# Patient Record
Sex: Male | Born: 1970 | Race: Black or African American | Hispanic: No | Marital: Married | Smoking: Never smoker
Health system: Southern US, Community
[De-identification: ages and names within clinical notes are randomized; demographics above are authoritative.]

## PROBLEM LIST (undated history)

## (undated) DIAGNOSIS — E041 Nontoxic single thyroid nodule: Secondary | ICD-10-CM

## (undated) DIAGNOSIS — T7840XA Allergy, unspecified, initial encounter: Secondary | ICD-10-CM

## (undated) DIAGNOSIS — I10 Essential (primary) hypertension: Secondary | ICD-10-CM

## (undated) DIAGNOSIS — E785 Hyperlipidemia, unspecified: Secondary | ICD-10-CM

## (undated) HISTORY — DX: Essential (primary) hypertension: I10

## (undated) HISTORY — DX: Nontoxic single thyroid nodule: E04.1

## (undated) HISTORY — DX: Morbid (severe) obesity due to excess calories: E66.01

## (undated) HISTORY — PX: HERNIA REPAIR: SHX51

## (undated) HISTORY — DX: Allergy, unspecified, initial encounter: T78.40XA

## (undated) HISTORY — DX: Hyperlipidemia, unspecified: E78.5

---

## 2008-02-05 ENCOUNTER — Emergency Department (HOSPITAL_COMMUNITY): Admission: EM | Admit: 2008-02-05 | Discharge: 2008-02-05 | Payer: Self-pay | Admitting: *Deleted

## 2010-05-24 ENCOUNTER — Emergency Department (HOSPITAL_COMMUNITY)
Admission: EM | Admit: 2010-05-24 | Discharge: 2010-05-24 | Disposition: A | Discharge status: Home / Self Care | Payer: No Typology Code available for payment source | Attending: Emergency Medicine | Admitting: Emergency Medicine

## 2010-05-24 ENCOUNTER — Emergency Department (HOSPITAL_COMMUNITY): Payer: No Typology Code available for payment source

## 2010-05-24 DIAGNOSIS — S20219A Contusion of unspecified front wall of thorax, initial encounter: Secondary | ICD-10-CM | POA: Insufficient documentation

## 2010-05-24 DIAGNOSIS — S139XXA Sprain of joints and ligaments of unspecified parts of neck, initial encounter: Secondary | ICD-10-CM | POA: Insufficient documentation

## 2010-05-24 DIAGNOSIS — M25519 Pain in unspecified shoulder: Secondary | ICD-10-CM | POA: Insufficient documentation

## 2010-05-24 DIAGNOSIS — R51 Headache: Secondary | ICD-10-CM | POA: Insufficient documentation

## 2010-05-24 DIAGNOSIS — S40019A Contusion of unspecified shoulder, initial encounter: Secondary | ICD-10-CM | POA: Insufficient documentation

## 2010-05-24 DIAGNOSIS — H1130 Conjunctival hemorrhage, unspecified eye: Secondary | ICD-10-CM | POA: Insufficient documentation

## 2010-05-24 DIAGNOSIS — M542 Cervicalgia: Secondary | ICD-10-CM | POA: Insufficient documentation

## 2012-04-27 ENCOUNTER — Emergency Department (HOSPITAL_BASED_OUTPATIENT_CLINIC_OR_DEPARTMENT_OTHER): Payer: BC Managed Care – PPO

## 2012-04-27 ENCOUNTER — Encounter (HOSPITAL_BASED_OUTPATIENT_CLINIC_OR_DEPARTMENT_OTHER): Payer: Self-pay | Admitting: *Deleted

## 2012-04-27 ENCOUNTER — Emergency Department (HOSPITAL_BASED_OUTPATIENT_CLINIC_OR_DEPARTMENT_OTHER)
Admission: EM | Admit: 2012-04-27 | Discharge: 2012-04-27 | Disposition: A | Discharge status: Home / Self Care | Payer: BC Managed Care – PPO | Attending: Emergency Medicine | Admitting: Emergency Medicine

## 2012-04-27 DIAGNOSIS — R059 Cough, unspecified: Secondary | ICD-10-CM | POA: Insufficient documentation

## 2012-04-27 DIAGNOSIS — IMO0002 Reserved for concepts with insufficient information to code with codable children: Secondary | ICD-10-CM | POA: Insufficient documentation

## 2012-04-27 DIAGNOSIS — S20219A Contusion of unspecified front wall of thorax, initial encounter: Secondary | ICD-10-CM

## 2012-04-27 DIAGNOSIS — Y9389 Activity, other specified: Secondary | ICD-10-CM | POA: Insufficient documentation

## 2012-04-27 DIAGNOSIS — R05 Cough: Secondary | ICD-10-CM | POA: Insufficient documentation

## 2012-04-27 DIAGNOSIS — Y92410 Unspecified street and highway as the place of occurrence of the external cause: Secondary | ICD-10-CM | POA: Insufficient documentation

## 2012-04-27 MED ORDER — HYDROCODONE-ACETAMINOPHEN 5-325 MG PO TABS: 1.0000 | ORAL_TABLET | ORAL | Status: DC | PRN
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
## 2013-04-04 ENCOUNTER — Ambulatory Visit: Payer: BC Managed Care – PPO

## 2013-04-04 ENCOUNTER — Ambulatory Visit (INDEPENDENT_AMBULATORY_CARE_PROVIDER_SITE_OTHER): Payer: BC Managed Care – PPO | Admitting: Family Medicine

## 2013-04-04 VITALS — BP 116/68 | HR 73 | Temp 98.0°F | Resp 16 | Ht 68.0 in | Wt 247.0 lb

## 2013-04-04 DIAGNOSIS — R1031 Right lower quadrant pain: Secondary | ICD-10-CM

## 2013-04-04 DIAGNOSIS — N323 Diverticulum of bladder: Secondary | ICD-10-CM

## 2013-04-04 DIAGNOSIS — E669 Obesity, unspecified: Secondary | ICD-10-CM | POA: Insufficient documentation

## 2013-04-04 DIAGNOSIS — K625 Hemorrhage of anus and rectum: Secondary | ICD-10-CM

## 2013-04-04 LAB — POCT CBC
GRANULOCYTE PERCENT: 41.8 % (ref 37–80)
HCT, POC: 44.1 % (ref 43.5–53.7)
Hemoglobin: 13.9 g/dL — AB (ref 14.1–18.1)
Lymph, poc: 3.4 (ref 0.6–3.4)
MCH, POC: 25.3 pg — AB (ref 27–31.2)
MCHC: 31.5 g/dL — AB (ref 31.8–35.4)
MCV: 80.3 fL (ref 80–97)
MID (CBC): 0.4 (ref 0–0.9)
MPV: 7.9 fL (ref 0–99.8)
PLATELET COUNT, POC: 374 10*3/uL (ref 142–424)
POC Granulocyte: 2.8 (ref 2–6.9)
POC LYMPH PERCENT: 51.7 %L — AB (ref 10–50)
POC MID %: 6.5 % (ref 0–12)
RBC: 5.49 M/uL (ref 4.69–6.13)
RDW, POC: 13.9 %
WBC: 6.6 10*3/uL (ref 4.6–10.2)

## 2013-04-04 LAB — IFOBT (OCCULT BLOOD): IFOBT: NEGATIVE

## 2013-04-04 NOTE — Progress Notes (Signed)
[facility] [address], [place] [id number] [phone]  Date:  04/04/2013   Name:  William RoupMonarch D Stanbery   DOB:  1970/12/10   MRN:  [id number]  PCP:  No PCP Per Patient    Chief Complaint: Rectal Bleeding and Abdominal Pain   History of Present Illness:  William Johnston is a 43 y.o. very pleasant male patient who presents with the following:  He is here today with concern of rectal bleeding.  Just about a week ago he had a BM and noted some blood on the TP and in the bowl.  He has had a couple of other stools with blood since then.  Last night when he was trying to have a BM he had some pain. Yesterday he had more bleeding than the other times.  The blood was bright red.    He did have hernia surgery a few years ago- he has noted pain in her lower abdomen that will come and go over the last week.  This pain seems to be located over his old incisions.  No other abdominal operations.    No nausea or vomiting.  He does not feel that he has been constipated.  However his stools have been hard and he has to strain some.   He is still eating ok.  No fever.  No body aches or chills.   He had bilateral inguinal hernia repair about 5 years ago.   No testicular pain or urinary sx.    He has never had a colonoscopy.   He has kept up with his physicals and all has been ok.   No family history of colon cancer, UC, or Crohnes disease  He is otherwise generally healthy  There are no active problems to display for this patient.   Past Medical History  Diagnosis Date  . Allergy     Past Surgical History  Procedure Laterality Date  . Hernia repair      History  Substance Use Topics  . Smoking status: Never Smoker   . Smokeless tobacco: Not on file  . Alcohol Use: No    History reviewed. No pertinent family history.  No Known Allergies  Medication list has been reviewed and updated.  No current outpatient prescriptions on file prior to visit.   No  current facility-administered medications on file prior to visit.    Review of Systems:  As per HPI- otherwise negative.   Physical Examination: Filed Vitals:   04/04/13 1752  BP: 116/68  Pulse: 73  Temp: 98 F (36.7 C)  Resp: 16   Filed Vitals:   04/04/13 1752  Height: 5\' 8"  (1.727 m)  Weight: 247 lb (112.038 kg)   Body mass index is 37.56 kg/(m^2). Ideal Body Weight: Weight in (lb) to have BMI = 25: 164.1  GEN: WDWN, NAD, Non-toxic, A & O x 3, obese, looks well HEENT: Atraumatic, Normocephalic. Neck supple. No masses, No LAD. Ears and Nose: No external deformity. CV: RRR, No M/G/R. No JVD. No thrill. No extra heart sounds. PULM: CTA B, no wheezes, crackles, rhonchi. No retractions. No resp. distress. No accessory muscle use. ABD: S, ND, +BS. No rebound. No HSM.  He has mild tenderness in the lower abdomen, more in the RLQ EXTR: No c/c/e NEURO Normal gait.  PSYCH: Normally interactive. Conversant. Not depressed or anxious appearing.  Calm demeanor.  Rectal: no gross bleeding. No visible fissure or hemorrhoid.  No pain with rectal exam.  [facility] reading (PRIMARY) by  Dr. Patsy Lager. abd 2 view: loop of bowel with increased gas in pelvis, non- specific. OW negative  ABDOMEN - 2 VIEW  COMPARISON: None.  FINDINGS: Scattered large and small bowel gas is noted. Air is noted within the sigmoid colon without significant dilatation. Postsurgical changes are noted. Bony structures are within normal limits. No free air is seen.  IMPRESSION: Nonspecific abdomen.   Results for orders placed in visit on 04/04/13  POCT CBC      Result Value Ref Range   WBC 6.6  4.6 - 10.2 K/uL   Lymph, poc 3.4  0.6 - 3.4   POC LYMPH PERCENT 51.7 (*) 10 - 50 %L   MID (cbc) 0.4  0 - 0.9   POC MID % 6.5  0 - 12 %M   POC Granulocyte 2.8  2 - 6.9   Granulocyte percent 41.8  37 - 80 %G   RBC 5.49  4.69 - 6.13 M/uL   Hemoglobin 13.9 (*) 14.1 - 18.1 g/dL   HCT, POC 13.2  44.0 - 53.7 %   MCV  80.3  80 - 97 fL   MCH, POC 25.3 (*) 27 - 31.2 pg   MCHC 31.5 (*) 31.8 - 35.4 g/dL   RDW, POC 10.2     Platelet Count, POC 374  142 - 424 K/uL   MPV 7.9  0 - 99.8 fL  IFOBT (OCCULT BLOOD)      Result Value Ref Range   IFOBT Negative     Assessment and Plan: Rectal bleeding - Plan: POCT CBC, IFOBT POC (occult bld, rslt in office), Ambulatory referral to Gastroenterology  RLQ abdominal pain - Plan: DG Abd 2 Views  Rectal bleeding- likely due to a hemorroid or other benign cause but will refer to GI for eval, possible colonoscopy. He agrees with this plan.  If any worsening or gross bleeding in the meantime he will seek care, use colace to soften stool.  Discussed mild RLQ tenderness in detail.  I do not strongly suspect that he could have appendicitis due to normal wbc count, lack or anorexia, vomiting, tachycardia or fever.  However I am certianly glad to order a CT now if he would like. He declines CT but will follow-up right away if pain worsens or persists.    Signed Abbe Amsterdam, MD

## 2013-04-04 NOTE — Patient Instructions (Signed)
Please start using a stool softener such as docusate sodium once or twice a day to keep your stool soft and moving.  Try not to strain to have a BM.  I will refer you to GI- if you do not hear about this in the next couple of weeks please give us a call. If you develop any severe or persistent bleeding, feel faint, or have persistent or severe right lower quadrant or abdominal pain please get help right away

## 2013-04-10 ENCOUNTER — Encounter: Payer: Self-pay | Admitting: Gastroenterology

## 2013-05-21 ENCOUNTER — Ambulatory Visit: Payer: BC Managed Care – PPO | Admitting: Gastroenterology

## 2013-07-08 ENCOUNTER — Ambulatory Visit: Payer: BC Managed Care – PPO | Admitting: Gastroenterology

## 2015-03-05 IMAGING — CR DG ABDOMEN 2V
2 series · 2 of 2 positions shown · non-contrast
Comparison: None.

CLINICAL DATA: Abdominal pain

EXAM:
ABDOMEN - 2 VIEW

[AP (1 of 2)]
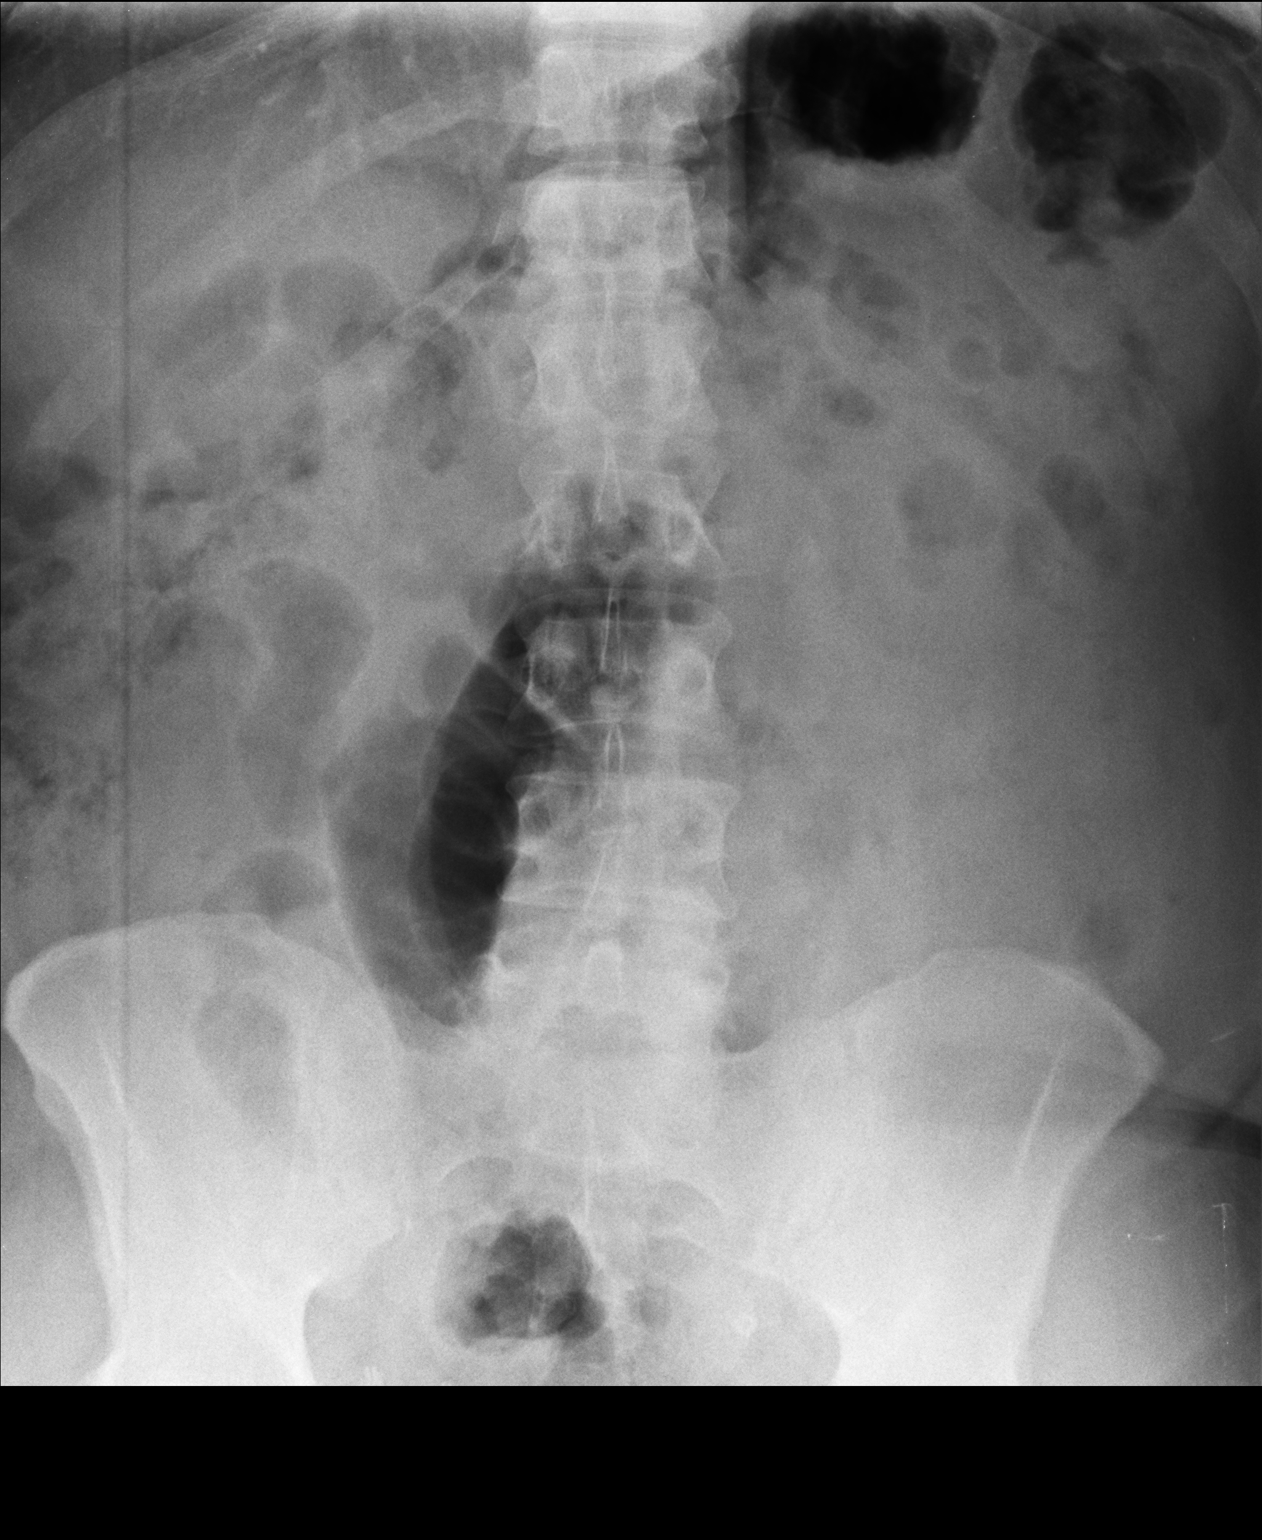

[AP (2 of 2)]
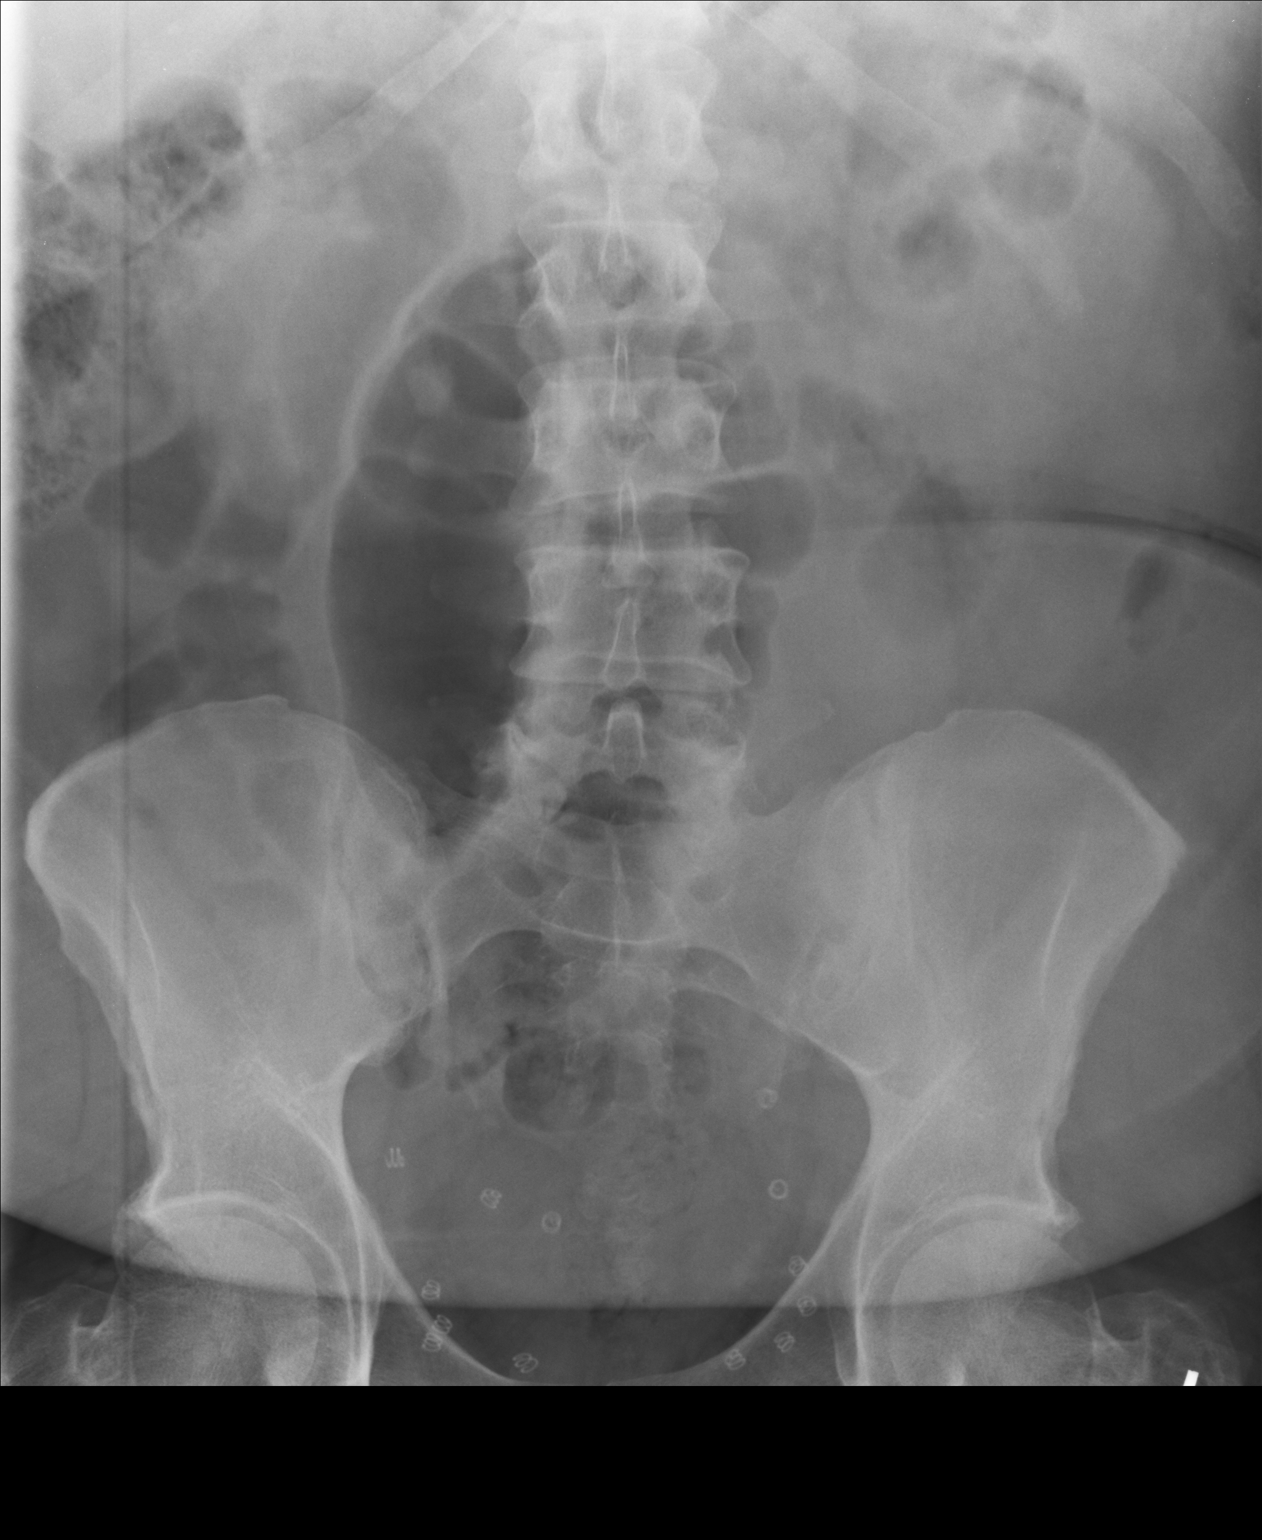

[2 of 2 positions shown; findings below may reference images not displayed]

FINDINGS: Scattered large and small bowel gas is noted. Air is noted within
the sigmoid colon without significant dilatation. Postsurgical
changes are noted. Bony structures are within normal limits. No free
air is seen.
IMPRESSION: Nonspecific abdomen.

## 2015-10-07 ENCOUNTER — Encounter: Payer: Self-pay | Admitting: Podiatry

## 2015-10-07 ENCOUNTER — Ambulatory Visit (INDEPENDENT_AMBULATORY_CARE_PROVIDER_SITE_OTHER): Payer: 59

## 2015-10-07 ENCOUNTER — Ambulatory Visit (INDEPENDENT_AMBULATORY_CARE_PROVIDER_SITE_OTHER): Payer: 59 | Admitting: Podiatry

## 2015-10-07 VITALS — Resp 16 | Ht 69.0 in | Wt 240.0 lb

## 2015-10-07 DIAGNOSIS — M25572 Pain in left ankle and joints of left foot: Secondary | ICD-10-CM

## 2015-10-07 DIAGNOSIS — M779 Enthesopathy, unspecified: Secondary | ICD-10-CM

## 2015-10-07 MED ORDER — DICLOFENAC SODIUM 75 MG PO TBEC: 75.0000 mg | DELAYED_RELEASE_TABLET | Freq: Two times a day (BID) | ORAL | 2 refills | Status: DC

## 2015-10-07 MED ORDER — TRIAMCINOLONE ACETONIDE 10 MG/ML IJ SUSP
10.0000 mg | Freq: Once | INTRAMUSCULAR | Status: AC
  Administered 2015-10-07: 10 mg

## 2015-10-07 NOTE — Progress Notes (Signed)
   Subjective:    Patient ID: William Johnston, male    DOB: [birth year], 45 y.o.   MRN: [id number]  HPI Chief Complaint  Patient presents with  . Ankle Pain    left / with swelling x several wks..pt states "pain is sharp when walking"       Review of Systems  All other systems reviewed and are negative.      Objective:   Physical Exam        Assessment & Plan:

## 2015-10-10 NOTE — Progress Notes (Signed)
Subjective:     Patient ID: William Johnston, male   DOB: [birth year], 45 y.o.   MRN: [id number]  HPI patient presents stating she has flatfeet and also she's been getting discomfort around her medial ankle left with inflammation fluid buildup over the last few months   Review of Systems  All other systems reviewed and are negative.      Objective:   Physical Exam  Constitutional: He is oriented to person, place, and time.  Cardiovascular: Intact distal pulses.   Musculoskeletal: Normal range of motion.  Neurological: He is oriented to person, place, and time.  Skin: Skin is warm.  Nursing note and vitals reviewed.  neurovascular status intact muscle strength is adequate with patient found to have flatfoot deformity and inflammation of the medial ankle left posterior tibial tendon as it comes under the medial malleolus. 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 Discussed long-term orthotics and patient will be seen back to recheck  William Johnston report was negative for signs of fracture and indicated depression of the arch

## 2015-10-22 ENCOUNTER — Ambulatory Visit: Payer: 59 | Admitting: Podiatry

## 2019-07-11 ENCOUNTER — Ambulatory Visit (INDEPENDENT_AMBULATORY_CARE_PROVIDER_SITE_OTHER): Payer: 59

## 2019-07-11 ENCOUNTER — Encounter: Payer: Self-pay | Admitting: Podiatry

## 2019-07-11 ENCOUNTER — Telehealth: Payer: Self-pay | Admitting: Podiatry

## 2019-07-11 ENCOUNTER — Ambulatory Visit (INDEPENDENT_AMBULATORY_CARE_PROVIDER_SITE_OTHER): Payer: 59 | Admitting: Podiatry

## 2019-07-11 ENCOUNTER — Other Ambulatory Visit: Payer: Self-pay

## 2019-07-11 DIAGNOSIS — M7750 Other enthesopathy of unspecified foot: Secondary | ICD-10-CM

## 2019-07-11 DIAGNOSIS — M7752 Other enthesopathy of left foot: Secondary | ICD-10-CM

## 2019-07-11 DIAGNOSIS — L600 Ingrowing nail: Secondary | ICD-10-CM

## 2019-07-11 MED ORDER — NEOMYCIN-POLYMYXIN-HC 3.5-10000-1 OT SOLN: 3.0000 [drp] | Freq: Four times a day (QID) | OTIC | 0 refills | Status: DC

## 2019-07-11 NOTE — Patient Instructions (Signed)
Place 1/4 cup of epsom salts in a quart of warm tap water.  Submerge your foot or feet in the solution and soak for 20 minutes.  This soak should be done twice a day.  Next, remove your foot or feet from solution, blot dry the affected area. Apply ointment and cover if instructed by your doctor.   IF YOUR SKIN BECOMES IRRITATED WHILE USING THESE INSTRUCTIONS, IT IS OKAY TO SWITCH TO  WHITE VINEGAR AND WATER.  As another alternative soak, you may use antibacterial soap and water.  Monitor for any signs/symptoms of infection. Call the office immediately if any occur or go directly to the emergency room. Call with any questions/concerns. Long Term Care Instructions-Post Nail Surgery  You have had your ingrown toenail and root treated with a chemical.  This chemical causes a burn that will drain and ooze like a blister.  This can drain for 6-8 weeks or longer.  It is important to keep this area clean, covered, and follow the soaking instructions dispensed at the time of your surgery.  This area will eventually dry and form a scab.  Once the scab forms you no longer need to soak or apply a dressing.  If at any time you experience an increase in pain, redness, swelling, or drainage, you should contact the office as soon as possible.   Plantar Fasciitis (Heel Spur Syndrome) with Rehab The plantar fascia is a fibrous, ligament-like, soft-tissue structure that spans the bottom of the foot. Plantar fasciitis is a condition that causes pain in the foot due to inflammation of the tissue. SYMPTOMS   Pain and tenderness on the underneath side of the foot.  Pain that worsens with standing or walking. CAUSES  Plantar fasciitis is caused by irritation and injury to the plantar fascia on the underneath side of the foot. Common mechanisms of injury include:  Direct trauma to bottom of the foot.  Damage to a small nerve that runs under the foot where the main fascia attaches to the heel bone.  Stress placed on  the plantar fascia due to bone spurs. RISK INCREASES WITH:   Activities that place stress on the plantar fascia (running, jumping, pivoting, or cutting).  Poor strength and flexibility.  Improperly fitted shoes.  Tight calf muscles.  Flat feet.  Failure to warm-up properly before activity.  Obesity. PREVENTION  Warm up and stretch properly before activity.  Allow for adequate recovery between workouts.  Maintain physical fitness:  Strength, flexibility, and endurance.  Cardiovascular fitness.  Maintain a health body weight.  Avoid stress on the plantar fascia.  Wear properly fitted shoes, including arch supports for individuals who have flat feet.  PROGNOSIS  If treated properly, then the symptoms of plantar fasciitis usually resolve without surgery. However, occasionally surgery is necessary.  RELATED COMPLICATIONS   Recurrent symptoms that may result in a chronic condition.  Problems of the lower back that are caused by compensating for the injury, such as limping.  Pain or weakness of the foot during push-off following surgery.  Chronic inflammation, scarring, and partial or complete fascia tear, occurring more often from repeated injections.  TREATMENT  Treatment initially involves the use of ice and medication to help reduce pain and inflammation. The use of strengthening and stretching exercises may help reduce pain with activity, especially stretches of the Achilles tendon. These exercises may be performed at home or with a therapist. Your caregiver may recommend that you use heel cups of arch supports to help reduce   stress on the plantar fascia. Occasionally, corticosteroid injections are given to reduce inflammation. If symptoms persist for greater than 6 months despite non-surgical (conservative), then surgery may be recommended.   MEDICATION   If pain medication is necessary, then nonsteroidal anti-inflammatory medications, such as aspirin and ibuprofen,  or other minor pain relievers, such as acetaminophen, are often recommended.  Do not take pain medication within 7 days before surgery.  Prescription pain relievers may be given if deemed necessary by your caregiver. Use only as directed and only as much as you need.  Corticosteroid injections may be given by your caregiver. These injections should be reserved for the most serious cases, because they may only be given a certain number of times.  HEAT AND COLD  Cold treatment (icing) relieves pain and reduces inflammation. Cold treatment should be applied for 10 to 15 minutes every 2 to 3 hours for inflammation and pain and immediately after any activity that aggravates your symptoms. Use ice packs or massage the area with a piece of ice (ice massage).  Heat treatment may be used prior to performing the stretching and strengthening activities prescribed by your caregiver, physical therapist, or athletic trainer. Use a heat pack or soak the injury in warm water.  SEEK IMMEDIATE MEDICAL CARE IF:  Treatment seems to offer no benefit, or the condition worsens.  Any medications produce adverse side effects.  EXERCISES- RANGE OF MOTION (ROM) AND STRETCHING EXERCISES - Plantar Fasciitis (Heel Spur Syndrome) These exercises may help you when beginning to rehabilitate your injury. Your symptoms may resolve with or without further involvement from your physician, physical therapist or athletic trainer. While completing these exercises, remember:   Restoring tissue flexibility helps normal motion to return to the joints. This allows healthier, less painful movement and activity.  An effective stretch should be held for at least 30 seconds.  A stretch should never be painful. You should only feel a gentle lengthening or release in the stretched tissue.  RANGE OF MOTION - Toe Extension, Flexion  Sit with your right / left leg crossed over your opposite knee.  Grasp your toes and gently pull them  back toward the top of your foot. You should feel a stretch on the bottom of your toes and/or foot.  Hold this stretch for 10 seconds.  Now, gently pull your toes toward the bottom of your foot. You should feel a stretch on the top of your toes and or foot.  Hold this stretch for 10 seconds. Repeat  times. Complete this stretch 3 times per day.   RANGE OF MOTION - Ankle Dorsiflexion, Active Assisted  Remove shoes and sit on a chair that is preferably not on a carpeted surface.  Place right / left foot under knee. Extend your opposite leg for support.  Keeping your heel down, slide your right / left foot back toward the chair until you feel a stretch at your ankle or calf. If you do not feel a stretch, slide your bottom forward to the edge of the chair, while still keeping your heel down.  Hold this stretch for 10 seconds. Repeat 3 times. Complete this stretch 2 times per day.   STRETCH  Gastroc, Standing  Place hands on wall.  Extend right / left leg, keeping the front knee somewhat bent.  Slightly point your toes inward on your back foot.  Keeping your right / left heel on the floor and your knee straight, shift your weight toward the wall, not allowing your   back to arch.  You should feel a gentle stretch in the right / left calf. Hold this position for 10 seconds. Repeat 3 times. Complete this stretch 2 times per day.  STRETCH  Soleus, Standing  Place hands on wall.  Extend right / left leg, keeping the other knee somewhat bent.  Slightly point your toes inward on your back foot.  Keep your right / left heel on the floor, bend your back knee, and slightly shift your weight over the back leg so that you feel a gentle stretch deep in your back calf.  Hold this position for 10 seconds. Repeat 3 times. Complete this stretch 2 times per day.  STRETCH  Gastrocsoleus, Standing  Note: This exercise can place a lot of stress on your foot and ankle. Please complete this  exercise only if specifically instructed by your caregiver.   Place the ball of your right / left foot on a step, keeping your other foot firmly on the same step.  Hold on to the wall or a rail for balance.  Slowly lift your other foot, allowing your body weight to press your heel down over the edge of the step.  You should feel a stretch in your right / left calf.  Hold this position for 10 seconds.  Repeat this exercise with a slight bend in your right / left knee. Repeat 3 times. Complete this stretch 2 times per day.   STRENGTHENING EXERCISES - Plantar Fasciitis (Heel Spur Syndrome)  These exercises may help you when beginning to rehabilitate your injury. They may resolve your symptoms with or without further involvement from your physician, physical therapist or athletic trainer. While completing these exercises, remember:   Muscles can gain both the endurance and the strength needed for everyday activities through controlled exercises.  Complete these exercises as instructed by your physician, physical therapist or athletic trainer. Progress the resistance and repetitions only as guided.  STRENGTH - Towel Curls  Sit in a chair positioned on a non-carpeted surface.  Place your foot on a towel, keeping your heel on the floor.  Pull the towel toward your heel by only curling your toes. Keep your heel on the floor. Repeat 3 times. Complete this exercise 2 times per day.  STRENGTH - Ankle Inversion  Secure one end of a rubber exercise band/tubing to a fixed object (table, pole). Loop the other end around your foot just before your toes.  Place your fists between your knees. This will focus your strengthening at your ankle.  Slowly, pull your big toe up and in, making sure the band/tubing is positioned to resist the entire motion.  Hold this position for 10 seconds.  Have your muscles resist the band/tubing as it slowly pulls your foot back to the starting position. Repeat 3  times. Complete this exercises 2 times per day.  Document Released: 01/16/2005 Document Revised: 04/10/2011 Document Reviewed: 04/30/2008 [employer] Patient Information 2014 [employer]  

## 2019-07-11 NOTE — Telephone Encounter (Signed)
Pt called to see if an antibiotic was to be called in if not please advise

## 2019-07-13 NOTE — Progress Notes (Signed)
Subjective:   Patient ID: William Johnston, male   DOB: 49 y.o.   MRN: [id number]   HPI Patient presents with 2 different problems with 1 being chronic inflammation swelling of the medial side of the left ankle and admits he probably should have gotten orthotics years ago secondarily he is getting chronic pain in the lateral borders of each hallux and states that they have been very tender and making shoe gear difficult.  Patient states he tries to trim them and soak them himself and he does not smoke likes to be active   Review of Systems  All other systems reviewed and are negative.       Objective:  Physical Exam Vitals and nursing note reviewed.  Constitutional:      Appearance: He is well-developed.  Pulmonary:     Effort: Pulmonary effort is normal.  Musculoskeletal:        General: Normal range of motion.  Skin:    General: Skin is warm.  Neurological:     Mental Status: He is alert.     Neurovascular status found to be intact muscle strength was found to be adequate range of motion within normal limits.  Patient is found to have inflammation of the medial side of the left posterior tib as it comes under the medial malleolus and inserts into the navicular.  Also has incurvated hallux nails of bilateral lateral borders that are painful when pressed and make shoe gear difficult with no active drainage or redness noted     Assessment:  Posterior tibial tendinitis left with ingrown toenail deformity hallux bilateral chronic     Plan:  H&P reviewed conditions recommended treatment for both conditions and patient wants this done.  I explained nail procedure allowed him to read and then signed consent form understanding risk and I infiltrated each hallux 60 mg like Marcaine mixture sterile prep applied and using sterile instrumentation I remove the borders exposed matrix applied phenol 3 applications 30 seconds followed by alcohol lavage sterile dressing.  Gave instructions on  soaks and reappoint and encouraged to leave dressing on 24 hours but take them off earlier if throbbing were to occur.  I then went ahead did sterile prep and injected the posterior tibial tendon near its insertion into the navicular 3 mg dexamethasone Kenalog 5 mg Xylocaine and applied fascial brace to lift up the arch  X-rays indicate moderate depression the arch no indications collapse medial longitudinal arch

## 2019-07-14 ENCOUNTER — Telehealth: Payer: Self-pay | Admitting: *Deleted

## 2019-07-14 NOTE — Telephone Encounter (Signed)
Returned patient's call from 07/11/19. Patient wanted to know if they needed an antibiotic.  I talked to Dr. Charlsie Merles this morning and he said that patient did not need an antibiotic.  I left a voicemail message letting patient know that he did not need the antibiotic.

## 2019-08-06 ENCOUNTER — Ambulatory Visit: Payer: 59 | Admitting: Podiatry

## 2019-08-07 ENCOUNTER — Ambulatory Visit: Payer: 59 | Admitting: Podiatry

## 2019-08-25 ENCOUNTER — Encounter: Payer: Self-pay | Admitting: Podiatry

## 2019-08-25 ENCOUNTER — Ambulatory Visit (INDEPENDENT_AMBULATORY_CARE_PROVIDER_SITE_OTHER): Payer: 59 | Admitting: Podiatry

## 2019-08-25 ENCOUNTER — Other Ambulatory Visit: Payer: Self-pay

## 2019-08-25 VITALS — Temp 98.2°F

## 2019-08-25 DIAGNOSIS — B351 Tinea unguium: Secondary | ICD-10-CM

## 2019-08-25 DIAGNOSIS — M21619 Bunion of unspecified foot: Secondary | ICD-10-CM

## 2019-08-25 LAB — HEPATIC FUNCTION PANEL
AG Ratio: 1.5 (calc) (ref 1.0–2.5)
ALT: 35 U/L (ref 9–46)
AST: 28 U/L (ref 10–40)
Albumin: 4.2 g/dL (ref 3.6–5.1)
Alkaline phosphatase (APISO): 61 U/L (ref 36–130)
Bilirubin, Direct: 0.1 mg/dL (ref 0.0–0.2)
Globulin: 2.8 g/dL (calc) (ref 1.9–3.7)
Indirect Bilirubin: 0.3 mg/dL (calc) (ref 0.2–1.2)
Total Bilirubin: 0.4 mg/dL (ref 0.2–1.2)
Total Protein: 7 g/dL (ref 6.1–8.1)

## 2019-08-25 MED ORDER — TERBINAFINE HCL 250 MG PO TABS: 250.0000 mg | ORAL_TABLET | Freq: Every day | ORAL | 0 refills | Status: DC

## 2019-08-27 NOTE — Progress Notes (Signed)
Subjective:   Patient ID: William Johnston, male   DOB: 49 y.o.   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  He wants to undergo this and is scheduled for laser and antifungal therapy was prescribed today.  I did review with the patient to watch with the oral and if any issues were to occur to let us know when we are doing liver function studies

## 2019-10-10 ENCOUNTER — Other Ambulatory Visit: Payer: Self-pay | Admitting: Internal Medicine

## 2019-10-10 DIAGNOSIS — E041 Nontoxic single thyroid nodule: Secondary | ICD-10-CM

## 2019-10-15 ENCOUNTER — Other Ambulatory Visit: Payer: Self-pay | Admitting: Internal Medicine

## 2019-10-15 DIAGNOSIS — E041 Nontoxic single thyroid nodule: Secondary | ICD-10-CM

## 2019-10-23 ENCOUNTER — Ambulatory Visit
Admission: RE | Admit: 2019-10-23 | Discharge: 2019-10-23 | Disposition: A | Discharge status: Home / Self Care | Payer: 59 | Source: Ambulatory Visit | Attending: Internal Medicine | Admitting: Internal Medicine

## 2019-10-23 ENCOUNTER — Other Ambulatory Visit: Payer: Self-pay

## 2019-10-23 ENCOUNTER — Other Ambulatory Visit (HOSPITAL_COMMUNITY)
Admission: RE | Admit: 2019-10-23 | Discharge: 2019-10-23 | Disposition: A | Discharge status: Home / Self Care | Payer: 59 | Source: Ambulatory Visit | Attending: Student | Admitting: Student

## 2019-10-23 DIAGNOSIS — E041 Nontoxic single thyroid nodule: Secondary | ICD-10-CM | POA: Diagnosis not present

## 2019-10-24 LAB — CYTOLOGY - NON PAP

## 2021-06-10 ENCOUNTER — Emergency Department (HOSPITAL_COMMUNITY)
Admission: EM | Admit: 2021-06-10 | Discharge: 2021-06-11 | Discharge status: Against Medical Advice | Payer: 59 | Source: Home / Self Care

## 2021-06-10 ENCOUNTER — Other Ambulatory Visit: Payer: Self-pay

## 2021-08-27 ENCOUNTER — Emergency Department (HOSPITAL_BASED_OUTPATIENT_CLINIC_OR_DEPARTMENT_OTHER): Payer: 59

## 2021-08-27 ENCOUNTER — Encounter (HOSPITAL_BASED_OUTPATIENT_CLINIC_OR_DEPARTMENT_OTHER): Payer: Self-pay

## 2021-08-27 ENCOUNTER — Other Ambulatory Visit: Payer: Self-pay

## 2021-08-27 ENCOUNTER — Emergency Department (HOSPITAL_BASED_OUTPATIENT_CLINIC_OR_DEPARTMENT_OTHER)
Admission: EM | Admit: 2021-08-27 | Discharge: 2021-08-27 | Disposition: A | Discharge status: Home / Self Care | Payer: 59 | Attending: Emergency Medicine | Admitting: Emergency Medicine

## 2021-08-27 DIAGNOSIS — R079 Chest pain, unspecified: Secondary | ICD-10-CM

## 2021-08-27 DIAGNOSIS — R778 Other specified abnormalities of plasma proteins: Secondary | ICD-10-CM

## 2021-08-27 DIAGNOSIS — R0789 Other chest pain: Secondary | ICD-10-CM | POA: Insufficient documentation

## 2021-08-27 DIAGNOSIS — Z6835 Body mass index (BMI) 35.0-35.9, adult: Secondary | ICD-10-CM | POA: Insufficient documentation

## 2021-08-27 DIAGNOSIS — R7989 Other specified abnormal findings of blood chemistry: Secondary | ICD-10-CM | POA: Diagnosis not present

## 2021-08-27 LAB — CBC
HCT: 45.9 % (ref 39.0–52.0)
Hemoglobin: 14.9 g/dL (ref 13.0–17.0)
MCH: 25.6 pg — ABNORMAL LOW (ref 26.0–34.0)
MCHC: 32.5 g/dL (ref 30.0–36.0)
MCV: 78.7 fL — ABNORMAL LOW (ref 80.0–100.0)
Platelets: 297 10*3/uL (ref 150–400)
RBC: 5.83 MIL/uL — ABNORMAL HIGH (ref 4.22–5.81)
RDW: 13.9 % (ref 11.5–15.5)
WBC: 10.4 10*3/uL (ref 4.0–10.5)
nRBC: 0 % (ref 0.0–0.2)

## 2021-08-27 LAB — BASIC METABOLIC PANEL
Anion gap: 11 (ref 5–15)
BUN: 12 mg/dL (ref 6–20)
CO2: 24 mmol/L (ref 22–32)
Calcium: 9.4 mg/dL (ref 8.9–10.3)
Chloride: 103 mmol/L (ref 98–111)
Creatinine, Ser: 0.86 mg/dL (ref 0.61–1.24)
GFR, Estimated: >60 mL/min (ref >60)
Glucose, Bld: 90 mg/dL (ref 70–99)
Potassium: 3.8 mmol/L (ref 3.5–5.1)
Sodium: 138 mmol/L (ref 135–145)

## 2021-08-27 LAB — TROPONIN I (HIGH SENSITIVITY)
Troponin I (High Sensitivity): 51 ng/L — ABNORMAL HIGH (ref <18)
Troponin I (High Sensitivity): 55 ng/L — ABNORMAL HIGH (ref <18)

## 2021-08-27 MED ORDER — ASPIRIN 81 MG PO CHEW
324.0000 mg | CHEWABLE_TABLET | Freq: Once | ORAL | Status: AC
  Administered 2021-08-27: 324 mg via ORAL
  Filled 2021-08-27: qty 4

## 2021-08-27 NOTE — ED Notes (Signed)
Dc home , dc instructions reviewed. Pt states understanding.

## 2021-08-27 NOTE — ED Triage Notes (Signed)
Patient here POV from Home.  Endorses Left Sided CP that began approximately for approximately 1 Month. Radiates to Left Shoulder and Left Collar Bone. Originally Intermittent and has Continued.   No SOB. No N/V/D. No Cough.   NAD Noted during Triage. A&Ox4. GCS 15. Ambulatory.

## 2021-08-27 NOTE — Discharge Instructions (Addendum)
Take an aspirin daily.  Follow-up with a cardiologist for further evaluation as we discussed.  Return to [facility] emergency room immediately for recurrent chest pain

## 2021-08-27 NOTE — ED Provider Notes (Signed)
[facility] EMERGENCY DEPT Provider Note   CSN: [id number] Arrival date & time: 08/27/21  1339     History  Chief Complaint  Patient presents with   Chest Pain    William Johnston is a 51 y.o. male.   Chest Pain  Pt initially thought he had a muscle injury after having a massage where his left arm was stretched however he did not have any trouble for weeks until this last week.  He gets an aching pain in the left chest.  It goes to the shoulder and arm.  He has taken ibuprofen with some relief.  Episodes last a few minutes.  No increase with activity.  No nausea or shortness of breath. No diaphoresis.  Last episode around 12 or 1pm.  Pt did work out earlier in the day.    Home Medications Prior to Admission medications   Medication Sig Start Date End Date Taking? Authorizing Provider  diclofenac (VOLTAREN) 75 MG EC tablet Take 1 tablet (75 mg total) by mouth 2 (two) times daily. 10/07/15   Wallene Huh, DPM  terbinafine (LAMISIL) 250 MG tablet Take 1 tablet (250 mg total) by mouth daily. 08/25/19   Wallene Huh, DPM      Allergies    Patient has no known allergies.    Review of Systems   Review of Systems  Cardiovascular:  Positive for chest pain.    Physical Exam Updated Vital Signs BP (!) 152/70   Pulse 80   Temp 98.4 F (36.9 C)   Resp 13   Ht 1.753 m (5\' 9" )   Wt 108.9 kg   SpO2 97%   BMI 35.45 kg/m  Physical Exam Vitals and nursing note reviewed.  Constitutional:      General: He is not in acute distress.    Appearance: He is well-developed.     Comments: Increased bmi  HENT:     Head: Normocephalic and atraumatic.     Right Ear: External ear normal.     Left Ear: External ear normal.  Eyes:     General: No scleral icterus.       Right eye: No discharge.        Left eye: No discharge.     Conjunctiva/sclera: Conjunctivae normal.  Neck:     Trachea: No tracheal deviation.  Cardiovascular:     Rate and Rhythm: Normal rate and  regular rhythm.  Pulmonary:     Effort: Pulmonary effort is normal. No respiratory distress.     Breath sounds: Normal breath sounds. No stridor. No wheezing or rales.  Abdominal:     General: Bowel sounds are normal. There is no distension.     Palpations: Abdomen is soft.     Tenderness: There is no abdominal tenderness. There is no guarding or rebound.  Musculoskeletal:        General: No tenderness or deformity.     Cervical back: Neck supple.  Skin:    General: Skin is warm and dry.     Findings: No rash.  Neurological:     General: No focal deficit present.     Mental Status: He is alert.     Cranial Nerves: No cranial nerve deficit (no facial droop, extraocular movements intact, no slurred speech).     Sensory: No sensory deficit.     Motor: No abnormal muscle tone or seizure activity.     Coordination: Coordination normal.  Psychiatric:        Mood  and Affect: Mood normal.     ED Results / Procedures / Treatments   Labs (all labs ordered are listed, but only abnormal results are displayed) Labs Reviewed  CBC - Abnormal; Notable for the following components:      Result Value   RBC 5.83 (*)    MCV 78.7 (*)    MCH 25.6 (*)    All other components within normal limits  TROPONIN I (HIGH SENSITIVITY) - Abnormal; Notable for the following components:   Troponin I (High Sensitivity) 51 (*)    All other components within normal limits  TROPONIN I (HIGH SENSITIVITY) - Abnormal; Notable for the following components:   Troponin I (High Sensitivity) 55 (*)    All other components within normal limits  BASIC METABOLIC PANEL    EKG EKG Interpretation  Date/Time:  Saturday August 27 2021 13:48:12 EDT Ventricular Rate:  85 PR Interval:  168 QRS Duration: 76 QT Interval:  348 QTC Calculation: 414 R Axis:   24 Text Interpretation: Normal sinus rhythm Nonspecific T wave abnormality Abnormal ECG No previous ECGs available Confirmed by Linwood Dibbles [phone]) on 08/27/2021 4:17:53  PM  Radiology DG Chest 2 View  Result Date: 08/27/2021 CLINICAL DATA:  CP EXAM: CHEST - 2 VIEW COMPARISON:  April 27, 2012 FINDINGS: The heart size and mediastinal contours are within normal limits. Both lungs are clear and stable. The visualized skeletal structures are unremarkable. IMPRESSION: No active cardiopulmonary disease. Electronically Signed   By: Marjo Bicker M.D.   On: 08/27/2021 14:41    Procedures Procedures    Medications Ordered in ED Medications  aspirin chewable tablet 324 mg (324 mg Oral Given 08/27/21 1646)    ED Course/ Medical Decision Making/ A&P Clinical Course as of 08/27/21 1736  Sat Aug 27, 2021  1648 Troponin I (High Sensitivity)(!) Elevated, slight increase from last  [JK]  1649 Basic metabolic panel Nl  [JK]  1649 CBC(!) Nl  [JK]  1659 Discussed with Dr Anne Fu.   Would be reasonable for close outpatient eval, coronary CT [JK]    Clinical Course User Index [JK] Linwood Dibbles, MD                           Medical Decision Making Problems Addressed: Chest pain, unspecified type: acute illness or injury that poses a threat to life or bodily functions Elevated troponin: acute illness or injury  Amount and/or Complexity of Data Reviewed Labs: ordered. Decision-making details documented in ED Course. Radiology: ordered and independent interpretation performed. Discussion of management or test interpretation with external provider(s): Case was discussed with Dr. Anne Fu cardiology.  EKG is reassuring.  Troponin is elevated but flat.  Patient is pain-free here.  We will plan discharge and very close outpatient follow-up in the cardiology clinic this week.  Warning signs and precautions discussed with the patient.  He is comfortable with this plan  Risk OTC drugs.   Patient presented with chest pain.  Concerning for the possibility of cardiac etiology.  No evidence of pneumonia.  Symptoms not suggestive of PE.  Patient's troponins were slightly elevated  but unchanged.  He has not actually been having symptoms with exercise.  Discussed with Dr. Anne Fu regarding possible hospitalization and further work-up versus close cardiology follow-up.  Patient is comfortable with following up and as an outpatient.  Plan on cardiology eval this week.  I instructed the patient take an aspirin daily.  He understands to return for recurrent  symptoms.        Final Clinical Impression(s) / ED Diagnoses Final diagnoses:  Chest pain, unspecified type  Elevated troponin    Rx / DC Orders ED Discharge Orders          Ordered    Ambulatory referral to Cardiology       Comments: If you have not heard from the Cardiology office within the next 72 hours please call [phone].   08/27/21 1735              Linwood Dibbles, MD 08/27/21 1737

## 2021-08-29 ENCOUNTER — Ambulatory Visit (INDEPENDENT_AMBULATORY_CARE_PROVIDER_SITE_OTHER): Payer: 59 | Admitting: Cardiology

## 2021-08-29 ENCOUNTER — Encounter: Payer: Self-pay | Admitting: Cardiology

## 2021-08-29 VITALS — BP 140/80 | HR 72 | Ht 69.5 in | Wt 281.0 lb

## 2021-08-29 DIAGNOSIS — R072 Precordial pain: Secondary | ICD-10-CM | POA: Diagnosis not present

## 2021-08-29 DIAGNOSIS — E782 Mixed hyperlipidemia: Secondary | ICD-10-CM

## 2021-08-29 DIAGNOSIS — I10 Essential (primary) hypertension: Secondary | ICD-10-CM

## 2021-08-29 MED ORDER — METOPROLOL TARTRATE 100 MG PO TABS: ORAL_TABLET | ORAL | 0 refills | Status: DC

## 2021-08-29 NOTE — Patient Instructions (Addendum)
Medication Instructions:  Continue same medications   Lab Work: None ordered   Testing/Procedures: Coronary CT   will be scheduled after approved by insurance     Follow instructions below   Follow-Up: At [facility], you and your health needs are our priority.  As part of our continuing mission to provide you with exceptional heart care, we have created designated Provider Care Teams.  These Care Teams include your primary Cardiologist (physician) and Advanced Practice Providers (APPs -  Physician Assistants and Nurse Practitioners) who all work together to provide you with the care you need, when you need it.  We recommend signing up for the patient portal called "MyChart".  Sign up information is provided on this After Visit Summary.  MyChart is used to connect with patients for Virtual Visits (Telemedicine).  Patients are able to view lab/test results, encounter notes, upcoming appointments, etc.  Non-urgent messages can be sent to your provider as well.   To learn more about what you can do with MyChart, go to [email or web address].      Your next appointment:  To Be Determined    The format for your next appointment: Office    Provider:  Dr.Jordan      Your cardiac CT will be scheduled at one of the below locations:   [facility] [address] [place], [place] [address] [phone]  OR  [facility] [address] [place], [place] [address] [phone]  If scheduled at [facility], please arrive at the [facility] Entrance (Entrance C2) of [facility] 30 minutes prior to test start time. You can use the FREE valet parking offered at entrance C (encouraged to control the heart rate for the test)  Proceed to the [facility] Radiology Department (first floor) to check-in and test prep.  All radiology patients and guests should use entrance C2 at [facility],  accessed from [facility], even though the hospital's physical address listed is [address].    If scheduled at [facility], please arrive 15 mins early for check-in and test prep.  Please follow these instructions carefully (unless otherwise directed):  Hold all erectile dysfunction medications at least 3 days (72 hrs) prior to test.  On the Night Before the Test: Be sure to Drink plenty of water. Do not consume any caffeinated/decaffeinated beverages or chocolate 12 hours prior to your test. Do not take any antihistamines 12 hours prior to your test.   On the Day of the Test: Drink plenty of water until 1 hour prior to the test. Do not eat any food 4 hours prior to the test. You may take your regular medications prior to the test.  Take metoprolol 100 mg two hours prior to test.          After the Test: Drink plenty of water. After receiving IV contrast, you may experience a mild flushed feeling. This is normal. On occasion, you may experience a mild rash up to 24 hours after the test. This is not dangerous. If this occurs, you can take Benadryl 25 mg and increase your fluid intake. If you experience trouble breathing, this can be serious. If it is severe call 911 IMMEDIATELY. If it is mild, please call our office.   We will call to schedule your test 2-4 weeks out understanding that some insurance companies will need an authorization prior to the service being performed.   For  non-scheduling related questions, please contact the cardiac imaging nurse navigator should you have any questions/concerns: Rockwell Alexandria, Cardiac Imaging Nurse Navigator Larey Brick, Cardiac Imaging Nurse Navigator [facility] Direct Office Dial: [phone]   For scheduling needs, including cancellations and rescheduling, please call [place], [phone].    Important Information About Sugar

## 2021-08-29 NOTE — Progress Notes (Signed)
Cardiology Office Note   Date:  08/29/2021   ID:  JACOBI NILE, DOB [birth year], MRN [id number]  PCP:  Renford Dills, MD  Cardiologist:   Sibley Rolison [place], MD   No chief complaint on file.     History of Present Illness: William Johnston is a 51 y.o. male who is seen at the request of Dr Lynelle Doctor for evaluation of chest pain. He has a history of morbid obesity, HLD, and elevated BP that has not required medication. He reports intermittent chest pain. Over the weekend he noted pain in his central chest radiating to his left shoulder and some pain in his left clavicle. He went to the ED. Ecg showed no acute change. Troponins were 51 and 55. He is currently pain free. 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     Past Medical History:  Diagnosis Date   Allergy    Hyperlipidemia    Hypertension    Morbid obesity (HCC)    Thyroid nodule     Past Surgical History:  Procedure Laterality Date   HERNIA REPAIR       Current Outpatient Medications  Medication Sig Dispense Refill   Multiple Vitamin (MULTIVITAMIN) tablet Take 1 tablet by mouth daily.     tadalafil (CIALIS) 20 MG tablet Take 20 mg by mouth daily as needed.     No current facility-administered medications for this visit.    Allergies:   Patient has no known allergies.    Social History:  The patient  reports that he has never smoked. He has never used smokeless tobacco. He reports that he does not drink alcohol and does not use drugs.   Family History:  The patient's family history includes Ovarian cancer in his mother.    ROS:  Please see the history of present illness.   Otherwise, review of systems are positive for none.   All other systems are reviewed and negative.    PHYSICAL EXAM: VS:  BP 140/80   Pulse 72   Ht 5' 9.5" (1.765 m)   Wt 281 lb (127.5 kg)   SpO2 97%   BMI 40.90 kg/m  , BMI Body mass index is 40.9 kg/m. GEN: Well nourished, obese, in no acute distress HEENT:  normal Neck: no JVD, carotid bruits, or masses Cardiac: RRR; no murmurs, rubs, or gallops,no edema  Respiratory:  clear to auscultation bilaterally, normal work of breathing GI: soft, nontender, nondistended, + BS MS: no deformity or atrophy Skin: warm and dry, no rash Neuro:  Strength and sensation are intact Psych: euthymic mood, full affect   EKG:  EKG is not ordered today. The ekg ordered 08/27/21 demonstrates NSR with nonspecific T wave flattening inferiorly. I have personally reviewed and interpreted this study.    Recent Labs: 08/27/2021: BUN 12; Creatinine, Ser 0.86; Hemoglobin 14.9; Platelets 297; Potassium 3.8; Sodium 138   Labs dated 05/30/21: cholesterol 193, triglycerides 269, LDL 111, HDL 35. CMET and TSH normal.  Lipid Panel No results found for: "CHOL", "TRIG", "HDL", "CHOLHDL", "VLDL", "LDLCALC", "LDLDIRECT"    Wt Readings from Last 3 Encounters:  08/29/21 281 lb (127.5 kg)  08/27/21 240 lb 1.3 oz (108.9 kg)  10/07/15 240 lb (108.9 kg)      Other studies Reviewed: Additional studies/ records that were reviewed today include: none. Review of the above records demonstrates: N/A   ASSESSMENT AND PLAN:  1.  Precordial chest pain with intermediate risk for CAD. We discussed need for ischemic  evaluation. We discussed options including stress testing, coronary CTA or cardiac cath. After discussion recommend coronary CTA. This will also help inform us about how aggressively we need to treat his lipids 2. HTN managed medically for now. May need to consider antihypertensive therapy 3. HLD mixed. Encourage weight loss and healthy diet. If he has CAD on CT will need statin therapy 4. Morbid obesity.    Current medicines are reviewed at length with the patient today.  The patient does not have concerns regarding medicines.  The following changes have been made:  no change  Labs/ tests ordered today include:   Orders Placed This Encounter  Procedures   CT CORONARY  MORPH W/CTA COR W/SCORE W/CA W/CM &/OR WO/CM         Disposition:   FU with me post CT  Signed, Arushi Partridge [place], MD  08/29/2021 10:55 AM    [facility] [address] [place], [place], [address] Phone [phone], Fax [phone]

## 2021-09-03 ENCOUNTER — Telehealth: Payer: Self-pay | Admitting: Cardiology

## 2021-09-03 NOTE — Telephone Encounter (Signed)
Patient called stating that he has been having chest discomfort that he describes as a discomfort over his sternum that hurts to touch his sternum.  He has some back soreness and is constipated.  It comes and goes throughout the day.  He says that touching his sternum makes the discomfort worse and feels like something is off. Movement makes it worse.  This is not the symptoms that he had when he saw Dr. Sw[place]iland recently.    I think his symptoms sound musculoskeletal but he keeps describing an achiness as well.  I recommended that he go to the ER to be evaluated

## 2021-09-06 ENCOUNTER — Telehealth: Payer: Self-pay | Admitting: Cardiology

## 2021-09-06 NOTE — Telephone Encounter (Signed)
Pt c/o of Chest Pain: STAT if CP now or developed within 24 hours  1. Are you having CP right now?  Not at this time-Discomfort in his chest- hard to describe  2. Are you experiencing any other symptoms (ex. SOB, nausea, vomiting, sweating)? Short of breath a little  3. How long have you been experiencing CP? 2 to 3 days  4. Is your CP continuous or coming and going? Comes and goes  5. Have you taken Nitroglycerin? neverr had any

## 2021-09-06 NOTE — Telephone Encounter (Signed)
I would recommend he take OTC antireflux therapy (prilosec or antacids) until we get CT results  Raegen Tarpley [place] MD, [facility]

## 2021-09-06 NOTE — Telephone Encounter (Signed)
Spoke to patient . Patient states he has been having  symptoms since the weekend.  He notice a chest discomfort  especially when he eats   he feels a discomfort in the sternum and pressure in his throat  below sternum.   He states after drinking a water it gets better.  He just wanted to know if it had to do with his heart.    He has a CCTA schedule for 09/15/21.  RN asked  if patient used any type of antacid medication - (i.e. tums , prilosec, prevacid) patient states he has not. He wanted to know if Dr Swaziland thought this was an issue.  RN informed patient symptoms did not not sound cardiac   RN informed patient keep appointment. Will defer to Dr [place]and  Patient verbalized understanding.

## 2021-09-06 NOTE — Telephone Encounter (Signed)
Spoke to patient Dr.Jordan's advice given. 

## 2021-09-08 ENCOUNTER — Emergency Department (HOSPITAL_COMMUNITY): Payer: 59

## 2021-09-08 ENCOUNTER — Emergency Department (HOSPITAL_COMMUNITY)
Admission: EM | Admit: 2021-09-08 | Discharge: 2021-09-08 | Disposition: A | Discharge status: Home / Self Care | Payer: 59 | Attending: Emergency Medicine | Admitting: Emergency Medicine

## 2021-09-08 ENCOUNTER — Telehealth: Payer: Self-pay | Admitting: Cardiology

## 2021-09-08 ENCOUNTER — Encounter (HOSPITAL_COMMUNITY): Payer: Self-pay

## 2021-09-08 ENCOUNTER — Other Ambulatory Visit: Payer: Self-pay

## 2021-09-08 DIAGNOSIS — R778 Other specified abnormalities of plasma proteins: Secondary | ICD-10-CM | POA: Insufficient documentation

## 2021-09-08 DIAGNOSIS — I10 Essential (primary) hypertension: Secondary | ICD-10-CM | POA: Diagnosis not present

## 2021-09-08 DIAGNOSIS — Z79899 Other long term (current) drug therapy: Secondary | ICD-10-CM | POA: Insufficient documentation

## 2021-09-08 DIAGNOSIS — R42 Dizziness and giddiness: Secondary | ICD-10-CM | POA: Insufficient documentation

## 2021-09-08 DIAGNOSIS — R0789 Other chest pain: Secondary | ICD-10-CM | POA: Diagnosis not present

## 2021-09-08 LAB — CBC
HCT: 48.8 % (ref 39.0–52.0)
Hemoglobin: 15.5 g/dL (ref 13.0–17.0)
MCH: 25.5 pg — ABNORMAL LOW (ref 26.0–34.0)
MCHC: 31.8 g/dL (ref 30.0–36.0)
MCV: 80.1 fL (ref 80.0–100.0)
Platelets: 335 10*3/uL (ref 150–400)
RBC: 6.09 MIL/uL — ABNORMAL HIGH (ref 4.22–5.81)
RDW: 13.8 % (ref 11.5–15.5)
WBC: 10 10*3/uL (ref 4.0–10.5)
nRBC: 0 % (ref 0.0–0.2)

## 2021-09-08 LAB — TROPONIN I (HIGH SENSITIVITY)
Troponin I (High Sensitivity): 38 ng/L — ABNORMAL HIGH (ref <18)
Troponin I (High Sensitivity): 38 ng/L — ABNORMAL HIGH (ref <18)

## 2021-09-08 LAB — BASIC METABOLIC PANEL
Anion gap: 8 (ref 5–15)
BUN: 10 mg/dL (ref 6–20)
CO2: 28 mmol/L (ref 22–32)
Calcium: 10.1 mg/dL (ref 8.9–10.3)
Chloride: 102 mmol/L (ref 98–111)
Creatinine, Ser: 0.95 mg/dL (ref 0.61–1.24)
GFR, Estimated: >60 mL/min (ref >60)
Glucose, Bld: 95 mg/dL (ref 70–99)
Potassium: 5 mmol/L (ref 3.5–5.1)
Sodium: 138 mmol/L (ref 135–145)

## 2021-09-08 NOTE — Telephone Encounter (Signed)
Called patient left message on personal voice mail to call back. 

## 2021-09-08 NOTE — Discharge Instructions (Signed)
Your evaluated due to your chest pain, reassuringly your labs have been stable and there is no sign of acute heart issue at this time.  I recommend keeping your appointment with cardiac imaging on the 17th. If you are having pain in your chest that is worse when you are pushing on it you can also use NSAIDs such as ibuprofen to help alleviate the pain.  Please make sure to come back to the ER if you are having any significant diaphoresis/sweating, increased chest pain, shortness of breath, nausea or vomiting or radiating pain to your arm.

## 2021-09-08 NOTE — Telephone Encounter (Signed)
STAT if patient feels like he/she is going to faint   Are you dizzy now? No  Do you feel faint or have you passed out?  No  Do you have any other symptoms? No  Have you checked your HR and BP (record if available)?               BP 141/68    HR 65   Patient stated he felt dizzy when sitting at his computer. Patient stated he felt a slight numbness in his left arm which comes and goes.

## 2021-09-08 NOTE — ED Provider Notes (Signed)
[facility] EMERGENCY DEPARTMENT Provider Note   CSN: [id number] Arrival date & time: 09/08/21  1436     History  Chief Complaint  Patient presents with   Chest Pain    William Johnston is a 51 y.o. male with a medical history of recent chest pain, obesity, hyperlipidemia, hypertension presenting for chest pain and episode of lightheadedness.  Patient notes that yesterday during work he had episodes of lightheadedness when he was sitting down in his chair, he was able to get up and walk around without any issues. Patient also noted some discomfort in his chest, more over his sternal area.  He did notice that the discomfort increased whenever he pushed on that region.  He denies any shortness of breath, headaches, vision changes, diaphoresis. He has met with a cardiologist already after his last ER visit for chest pain visit that also had stable troponin levels.  Plan right now is to have coronary CT evaluation on 8/17.     Home Medications Prior to Admission medications   Medication Sig Start Date End Date Taking? Authorizing Provider  metoprolol tartrate (LOPRESSOR) 100 MG tablet Take 100 mg  2 hours before Coronary CT 08/29/21   Martinique, Peter M, MD  Multiple Vitamin (MULTIVITAMIN) tablet Take 1 tablet by mouth daily.    [provider]  tadalafil (CIALIS) 20 MG tablet Take 20 mg by mouth daily as needed. 07/21/21   [provider]      Allergies    Patient has no known allergies.    Review of Systems   Review of Systems  Constitutional:  Negative for activity change and fatigue.  HENT:  Negative for congestion and trouble swallowing.   Eyes:  Negative for visual disturbance.  Respiratory:  Negative for cough, chest tightness, shortness of breath and wheezing.   Cardiovascular:  Positive for chest pain and palpitations. Negative for leg swelling.  Gastrointestinal:  Negative for abdominal distention, blood in stool, constipation, diarrhea,  nausea and vomiting.  Genitourinary:  Negative for difficulty urinating.  Musculoskeletal:  Negative for back pain and neck pain.  Neurological:  Positive for light-headedness. Negative for dizziness, weakness and headaches.    Physical Exam Updated Vital Signs BP (!) 153/70   Pulse 72   Temp 98.4 F (36.9 C) (Oral)   Resp 12   Ht _0  (1.778 m)   Wt 122.5 kg   SpO2 96%   BMI 38.74 kg/m  Physical Exam Constitutional:      Appearance: He is well-developed. He is obese.  HENT:     Head: Normocephalic and atraumatic.  Eyes:     Extraocular Movements: Extraocular movements intact.     Pupils: Pupils are equal, round, and reactive to light.  Cardiovascular:     Rate and Rhythm: Normal rate and regular rhythm.     Pulses:          Radial pulses are 2+ on the right side and 2+ on the left side.     Heart sounds: Normal heart sounds.  Pulmonary:     Effort: Pulmonary effort is normal. No accessory muscle usage.     Breath sounds: No decreased breath sounds or wheezing.  Chest:     Chest wall: Tenderness (in the left costochondral region) present.  Abdominal:     Palpations: Abdomen is soft.  Musculoskeletal:        General: Normal range of motion.     Cervical back: Normal range of motion and neck  supple.     Right lower leg: No edema.     Left lower leg: No edema.  Skin:    General: Skin is warm and dry.     Capillary Refill: Capillary refill takes less than 2 seconds.  Neurological:     General: No focal deficit present.     Mental Status: He is alert.     ED Results / Procedures / Treatments   Labs (all labs ordered are listed, but only abnormal results are displayed) Labs Reviewed  CBC - Abnormal; Notable for the following components:      Result Value   RBC 6.09 (*)    MCH 25.5 (*)    All other components within normal limits  TROPONIN I (HIGH SENSITIVITY) - Abnormal; Notable for the following components:   Troponin I (High Sensitivity) 38 (*)    All  other components within normal limits  TROPONIN I (HIGH SENSITIVITY) - Abnormal; Notable for the following components:   Troponin I (High Sensitivity) 38 (*)    All other components within normal limits  BASIC METABOLIC PANEL    EKG EKG Interpretation  Date/Time:  Thursday September 08 2021 15:00:17 EDT Ventricular Rate:  75 PR Interval:  164 QRS Duration: 74 QT Interval:  340 QTC Calculation: 379 R Axis:   49 Text Interpretation: Normal sinus rhythm Normal ECG When compared with ECG of 27-Aug-2021 13:48, PREVIOUS ECG IS PRESENT No significant change since last tracing Confirmed by Isla Pence [phone]) on 09/08/2021 8:15:52 PM  Radiology DG Chest 2 View  Result Date: 09/08/2021 CLINICAL DATA:  Chest pain EXAM: CHEST - 2 VIEW COMPARISON:  08/27/2021 FINDINGS: The heart size and mediastinal contours are within normal limits. Both lungs are clear. The visualized skeletal structures are unremarkable. IMPRESSION: No active cardiopulmonary disease. Electronically Signed   By: Elmer Picker M.D.   On: 09/08/2021 15:48    Procedures Procedures   Medications Ordered in ED Medications - No data to display  ED Course/ Medical Decision Making/ A&P                           Medical Decision Making Amount and/or Complexity of Data Reviewed Labs: ordered. Radiology: ordered.   William Johnston  is a 51 y.o. male with a history of HTN, HLD, obesity that presented for chest pain comfort and lightheadedness that occurred in the last 2 days.   Lightheadedness occurred when he was sitting at his desk but did not have any dizziness upon standing, unclear of the exact etiology and patient had no other symptoms at that time.  Troponin mildly elevated at 38 but trended stable, EKG was NSR without any abnormalities from prior.  CXR was negative for any acute abnormalities.  Patient was recently evaluated on 7/29 for a similar chest pain and was found to have stable troponins.  Patient  followed up with cardiology for precordial pain and they are proceeding with a coronary CT on 8/17.  Given all the normal results and the history of pain to palpation of the left chest wall, feel that part of this could be due to costochondritis.  The lightheaded episode that the patient felt could have possibly been related to blood pressure, but will be abnormal considering it was worse while he was sitting.  Recommend the patient hydrate well and keep his cardiology follow-up.  Final Clinical Impression(s) / ED Diagnoses Final diagnoses:  Chest wall pain    Rx /  DC Orders ED Discharge Orders     None         Rise Patience, DO 09/08/21 2109    Isla Pence, MD 09/08/21 2113

## 2021-09-08 NOTE — ED Provider Triage Note (Signed)
Emergency Medicine Provider Triage Evaluation Note  William Johnston , a 51 y.o. male  was evaluated in triage.  Pt complains of left arm numbness while working today. Previously seen in ER for chest pain and had elevated troponin levels, was discharged and told to follow-up with cardiology.  Had a cardiology appointment at the end of July and they recommended a coronary CT, which he scheduled for an 8/17.  He called the cardiologist today and they recommended he come to the ER for evaluation.   Review of Systems  Positive: Left arm tingling Negative: Chest pain, shortness of breath  Physical Exam  BP (!) 159/76 (BP Location: Right Arm)   Pulse 86   Temp 98.4 F (36.9 C) (Oral)   Resp 16   Ht 5\' 10"  (1.778 m)   Wt 122.5 kg   SpO2 95%   BMI 38.74 kg/m  Gen:   Awake, no distress   Resp:  Normal effort  MSK:   Moves extremities without difficulty  Other:  No facial droop, no slurred speech  Medical Decision Making  Medically screening exam initiated at 3:30 PM.  Appropriate orders placed.  was informed that the remainder of the evaluation will be completed by another provider, this initial triage assessment does not replace that evaluation, and the importance of remaining in the ED until their evaluation is complete.     Kaius Daino T, PA-C 09/08/21 1531

## 2021-09-08 NOTE — ED Triage Notes (Signed)
Pt arrives to ED POV c/o Left Sided chest pain that began around 11am this morning. States it starts in the center of chest and radiates to Lf chest and Lf arm. Pt states he felt light headed prior to CP. Pt was seen last month at ED for same and states his Troponin levels were elevated and was instructed to follow up with Cardiology. Pt states no pain at this time, No distress noted at this time. A/Ox4

## 2021-09-09 ENCOUNTER — Telehealth (HOSPITAL_COMMUNITY): Payer: Self-pay | Admitting: Emergency Medicine

## 2021-09-09 NOTE — Telephone Encounter (Signed)
Reaching out to patient to offer assistance regarding upcoming cardiac imaging study; pt verbalizes understanding of appt date/time, parking situation and where to check in, pre-test NPO status and medications ordered, and verified current allergies; name and call back number provided for further questions should they arise Rockwell Alexandria RN Navigator Cardiac Imaging [facility] [phone] office [phone] cell  Arrival 1200 w/c entrnace Denies iv issues Aware of nitro 100mg  metoprolol tart Holding cialis 72 hr

## 2021-09-12 ENCOUNTER — Ambulatory Visit (HOSPITAL_COMMUNITY): Admission: RE | Admit: 2021-09-12 | Payer: 59 | Source: Ambulatory Visit

## 2021-09-14 ENCOUNTER — Telehealth (HOSPITAL_COMMUNITY): Payer: Self-pay | Admitting: *Deleted

## 2021-09-14 NOTE — Telephone Encounter (Signed)
Attempted to call patient regarding upcoming cardiac CT appointment. °Left message on voicemail with name and callback number ° °Donyae Kohn RN Navigator Cardiac Imaging °[facility] °[phone] Office °[phone] Cell ° °

## 2021-09-15 ENCOUNTER — Other Ambulatory Visit: Payer: Self-pay

## 2021-09-15 ENCOUNTER — Ambulatory Visit (HOSPITAL_COMMUNITY)
Admission: RE | Admit: 2021-09-15 | Discharge: 2021-09-15 | Disposition: A | Discharge status: Home / Self Care | Payer: 59 | Source: Ambulatory Visit | Attending: Cardiology | Admitting: Cardiology

## 2021-09-15 ENCOUNTER — Other Ambulatory Visit (HOSPITAL_COMMUNITY): Payer: 59

## 2021-09-15 DIAGNOSIS — I10 Essential (primary) hypertension: Secondary | ICD-10-CM | POA: Diagnosis not present

## 2021-09-15 DIAGNOSIS — R072 Precordial pain: Secondary | ICD-10-CM | POA: Insufficient documentation

## 2021-09-15 DIAGNOSIS — E782 Mixed hyperlipidemia: Secondary | ICD-10-CM | POA: Insufficient documentation

## 2021-09-15 MED ORDER — ROSUVASTATIN CALCIUM 10 MG PO TABS: 10.0000 mg | ORAL_TABLET | Freq: Every day | ORAL | 3 refills | Status: DC

## 2021-09-15 MED ORDER — NITROGLYCERIN 0.4 MG SL SUBL: 0.8000 mg | SUBLINGUAL_TABLET | Freq: Once | SUBLINGUAL | Status: AC

## 2021-09-15 MED ORDER — IOHEXOL 350 MG/ML SOLN
95.0000 mL | Freq: Once | INTRAVENOUS | Status: AC | PRN
  Administered 2021-09-15: 95 mL via INTRAVENOUS

## 2021-09-15 MED ORDER — NITROGLYCERIN 0.4 MG SL SUBL
SUBLINGUAL_TABLET | SUBLINGUAL | Status: AC
  Administered 2021-09-15: 0.8 mg via SUBLINGUAL
  Filled 2021-09-15: qty 2

## 2021-09-15 NOTE — Progress Notes (Signed)
Lipid

## 2021-09-16 ENCOUNTER — Telehealth: Payer: Self-pay | Admitting: Cardiology

## 2021-09-16 NOTE — Telephone Encounter (Signed)
Spoke to patient . He states he did go to Er , nothing was found to be abnormal . He did have his CCTA yesterday.  RN informed he should get his result sometime next week . Patient voice understanding.   Encounter enclosed.

## 2021-09-16 NOTE — Telephone Encounter (Signed)
William Elizabeth, LPN  1/93/7902  5:42 PM EDT     Spoke to patient results and instructions given.He will start Crestor 10 mg daily.Repeat fasting lipid and hepatic panels with PCP in 3 months.Advised to eat a heart healthy diet and regular aerobic exercise 30 min daily.A list of low fat low cholesterol guidelines mailed to patient.Results sent to PCP Dr.Ronald Polite.    William M [place], MD  09/15/2021  1:40 PM EDT     This study demonstrates:  coronary CTA shows some coronary plaque but no obstruction. Nothing to cause his chest pain   Medication changes / Follow up studies / Other recommendations:   Would focus on risk factor modification. Would like to see LDL < 70. Would  start Crestor 10 mg daily. Should have follow up labs with PCP in 3 months. Can follow up with me PRN   Please send results to the PCP:  Renford Dills, MD  William [place], MD 09/15/2021 1:37 PM     Pt was calling just to go back over his Cardiac CT instructions [facility] LPN gave him last night.  Went over CTA results and recommendations as indicated above by Dr. [place]az[place].  Pt verbalized understanding and agrees with this plan.

## 2021-09-16 NOTE — Telephone Encounter (Signed)
Patient called wanting to speak with the nurse with question about his CT. Please advise

## 2021-09-22 IMAGING — US US FNA BIOPSY THYROID 1ST LESION
2 series · 12 of 25 positions shown · non-contrast
Comparison: US THYROID 10/03/2019

MEDICATIONS:
5 mL 1% lidocaine

COMPLICATIONS:
None immediate.

INDICATION: Indeterminate thyroid nodules of the right inferior and left
medial-inferior thyroid. Request is made for fine-needle aspiration
of indeterminate nodules.
TECHNIQUE: Informed written consent was obtained from the patient after a
discussion of the risks, benefits and alternatives to treatment.
Questions regarding the procedure were encouraged and answered. A
timeout was performed prior to the initiation of the procedure.

[Series 1: us fna biopsy thyroid 1st lesion · 0.07mm/px · 20 acquisitions, 6 frames shown (1 of 2)]
[im 2/20]
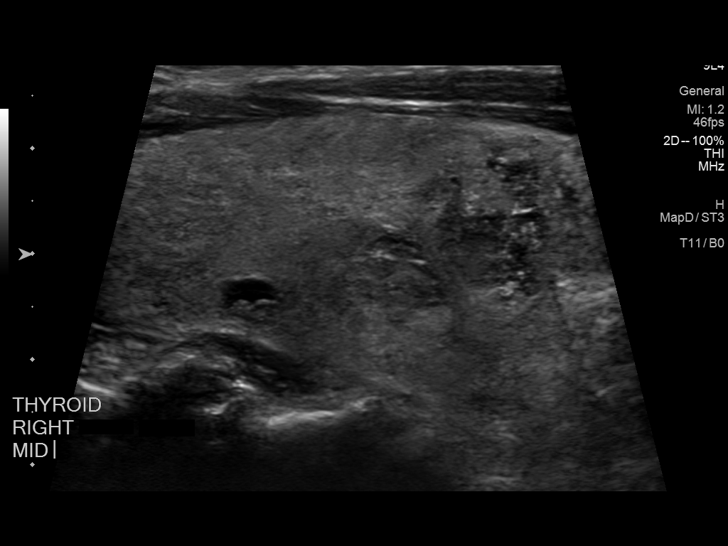
[im 6/20]
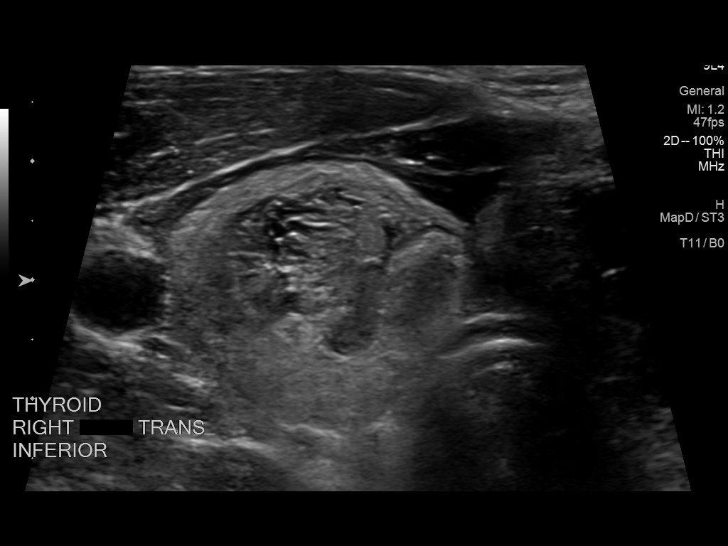
[im 9/20]
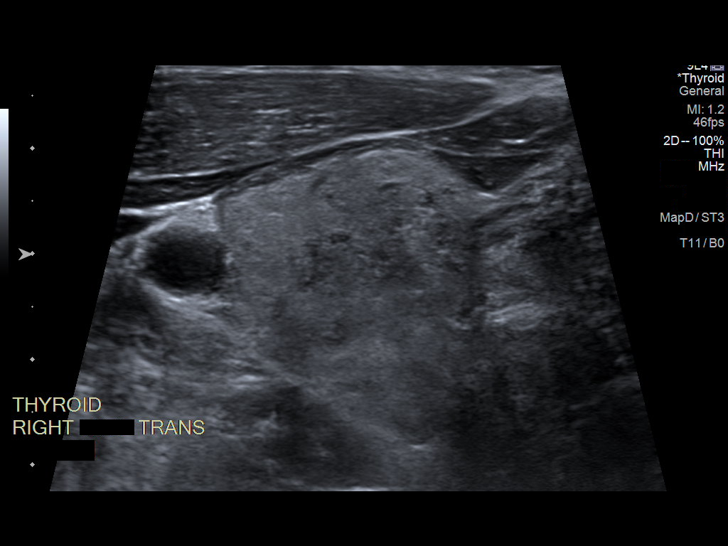
[im 13/20]
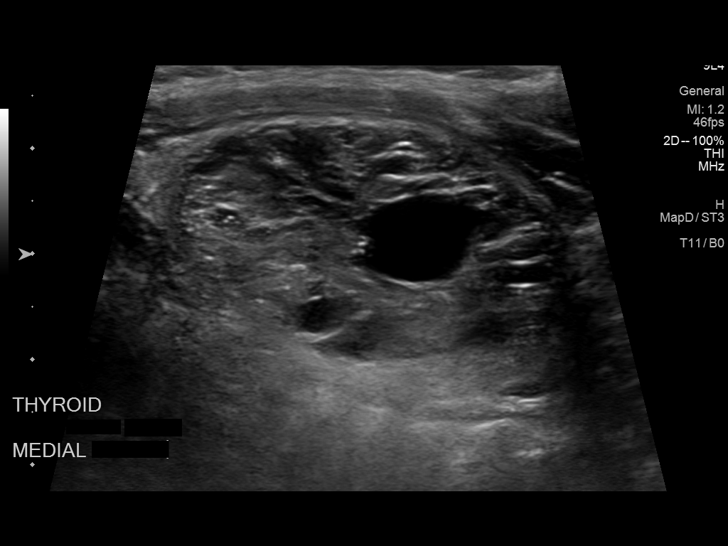
[im 16/20]
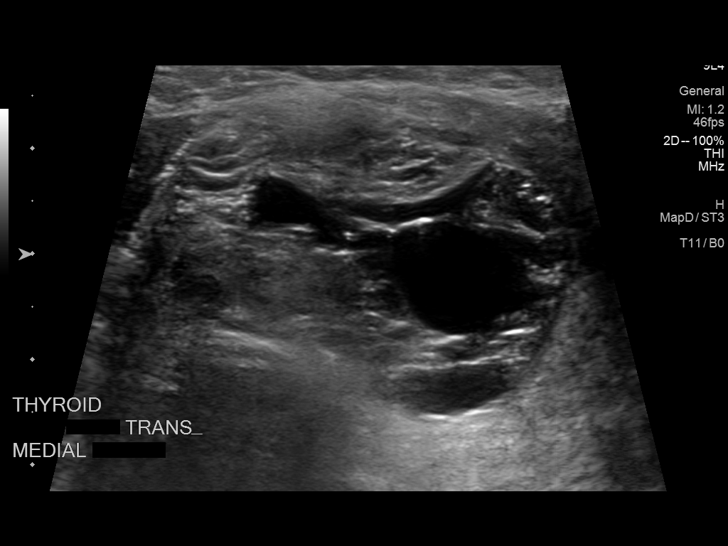
[im 20/20]
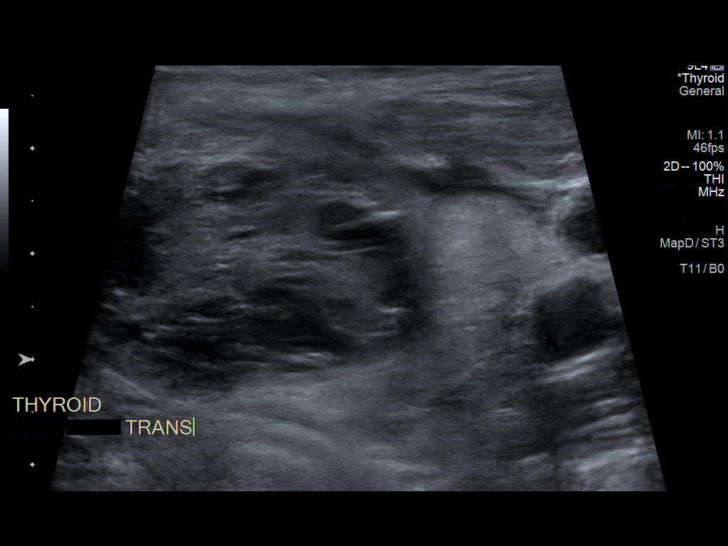

[Series 2: us fna biopsy thyroid 1st lesion · 23 acquisitions, 6 frames shown (2 of 2)]
[im 2/23]
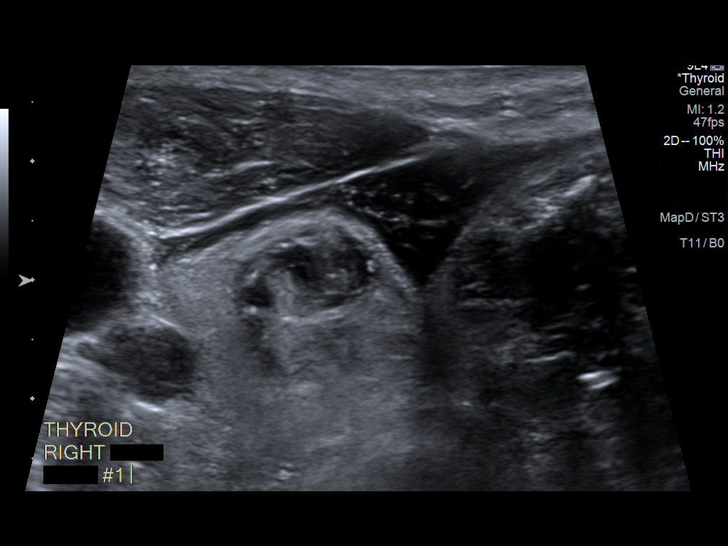
[im 6/23]
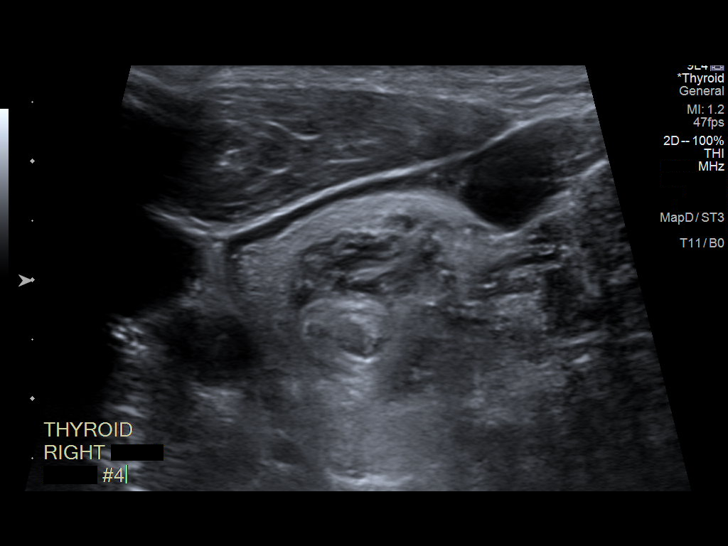
[im 10/23]
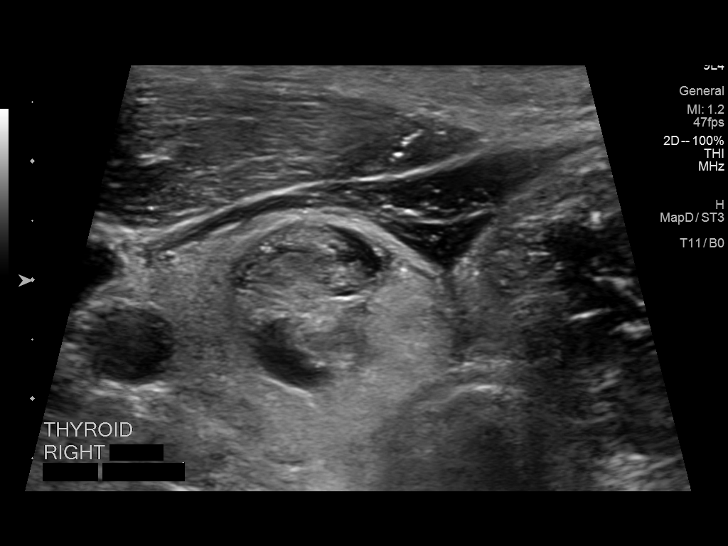
[im 13/23]
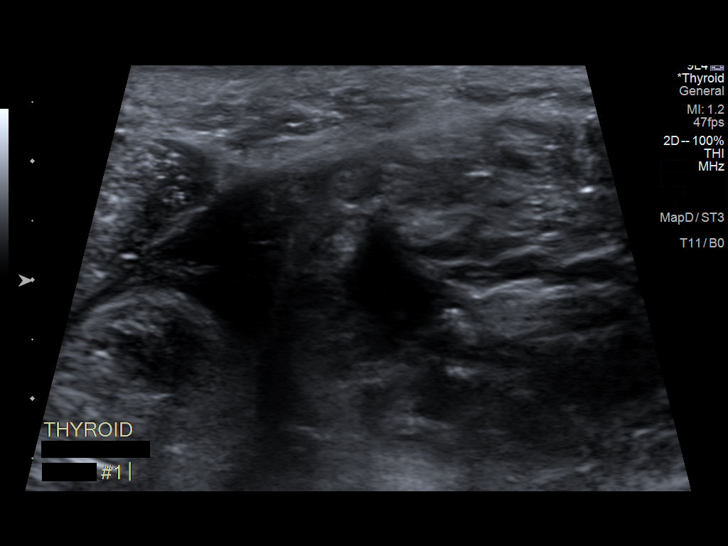
[im 17/23]
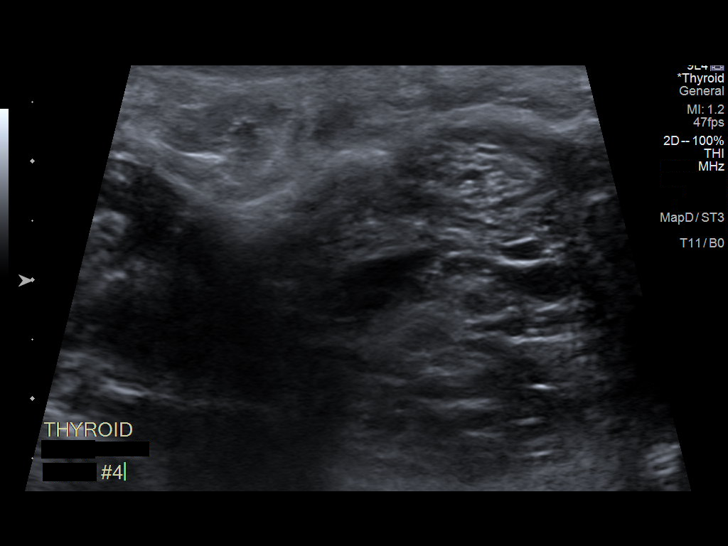
[im 21/23]
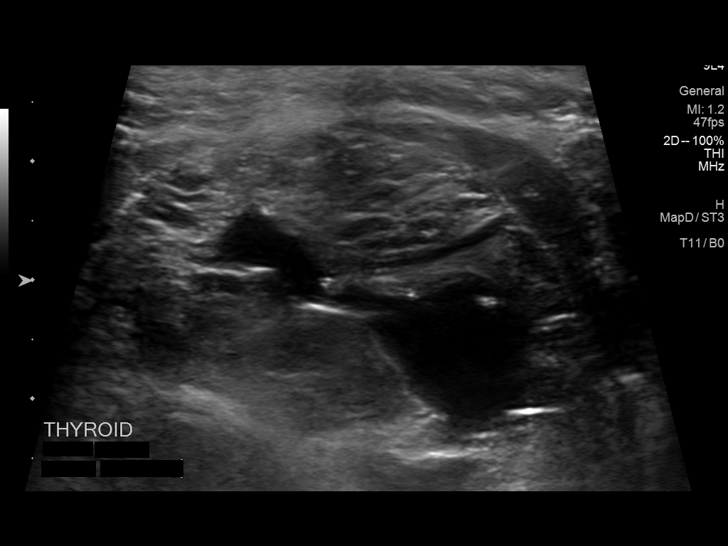

[12 of 25 positions shown; findings below may reference images not displayed]

Note, the preceding diagnostic thyroid ultrasound was reviewed prior
to the biopsy by supervising interventional radiologist, Dr. Don Lolito
and the right-sided thyroid nodule described as "superior" on
diagnostic ultrasound performed 10/03/2019 is "inferior" on today's
examination and the left-sided thyroid nodule described as
"superior" appears as a cluster of heterogeneous thyroid parenchyma
rather than a discrete nodule. Additionally, there is an
approximately 4.1 cm partially cystic though predominantly solid
nodule involving the majority of the left lobe of the thyroid which
was not imaged on the 10/03/2019 examination however meets
ultrasound criteria to undergo biopsy.

EXAM:
ULTRASOUND GUIDED FINE NEEDLE ASPIRATION OF INDETERMINATE THYROID
NODULES
On pre-procedural ultrasound scanning the previously described right
superior thyroid nodule was found to be in the inferior right
thyroid. The left nodule described as measuring 3.8 cm was actually
a conglomeration of smaller nodules. A left medial-inferior nodule
was identified and after discussion with Dr. Don Lolito was found to meet
criteria for biopsy today.

The procedure was planned. The neck was prepped in the usual sterile
fashion, and a sterile drape was applied covering the operative
field. A timeout was performed prior to the initiation of the
procedure. Local anesthesia was provided with 1% lidocaine.

Under direct ultrasound guidance, 5 FNA biopsies were performed of
the right inferior nodule with a 25 gauge needle. Multiple
ultrasound images were saved for procedural documentation purposes.
The samples were prepared and submitted to pathology. Sample was
also prepared for Afirma testing.

Under direct ultrasound guidance, 5 FNA biopsies were performed of
the left medial-inferior nodule with a 25 gauge needle. Multiple
ultrasound images were saved for procedural documentation purposes.
The samples were prepared and submitted to pathology. Sample was
also prepared for Afirma testing.

Limited post procedural scanning was negative for hematoma or
additional complication. Dressings were placed. The patient
tolerated the above procedures procedure well without immediate
postprocedural complication.
FINDINGS: Nodule reference number based on prior diagnostic ultrasound: 1

Maximum size: 3.4 cm

Location: Right; Inferior

ACR TI-RADS risk category: TR3 (3 points)

Reason for biopsy: meets ACR TI-RADS criteria

_________________________________________________________

Nodule reference number based on prior diagnostic ultrasound: Not
visualized on diagnostic thyroid ultrasound performed 10/03/2019

Maximum size:

Location: Left; Mid

ACR TI-RADS risk category: TR3 (3 points)

Reason for biopsy: meets ACR TI-RADS criteria

Ultrasound imaging confirms appropriate placement of the needles
within the thyroid nodule.
IMPRESSION: 1. Technically successful ultrasound guided fine needle aspiration
of indeterminate right inferior thyroid nodule.
2. Technically successful ultrasound guided fine needle aspiration
of indeterminate left medial-inferior thyroid nodule.

Note, the preceding diagnostic thyroid ultrasound was reviewed prior
to the biopsy by supervising interventional radiologist, Dr. Don Lolito,
and the right-sided thyroid nodule described as "superior" on
diagnostic ultrasound performed 10/03/2019 is "inferior" on today's
examination and the left-sided thyroid nodule described as
"superior" appears as a cluster of heterogeneous thyroid parenchyma
rather than a discrete nodule. Additionally, there is an
approximately 4.1 cm partially cystic though predominantly solid
nodule involving the majority of the left lobe of the thyroid which
was not imaged on the 10/03/2019 examination however meets
ultrasound criteria to undergo biopsy.

## 2021-09-22 IMAGING — US US FNA BIOPSY THYROID 1ST LESION
2 series · 12 of 25 positions shown · non-contrast
Comparison: US THYROID 10/03/2019

MEDICATIONS:
5 mL 1% lidocaine

COMPLICATIONS:
None immediate.

INDICATION: Indeterminate thyroid nodules of the right inferior and left
medial-inferior thyroid. Request is made for fine-needle aspiration
of indeterminate nodules.
TECHNIQUE: Informed written consent was obtained from the patient after a
discussion of the risks, benefits and alternatives to treatment.
Questions regarding the procedure were encouraged and answered. A
timeout was performed prior to the initiation of the procedure.

[Series 1: us fna biopsy thyroid 1st lesion · 0.07mm/px · 20 acquisitions, 6 frames shown (1 of 2)]
[im 2/20]
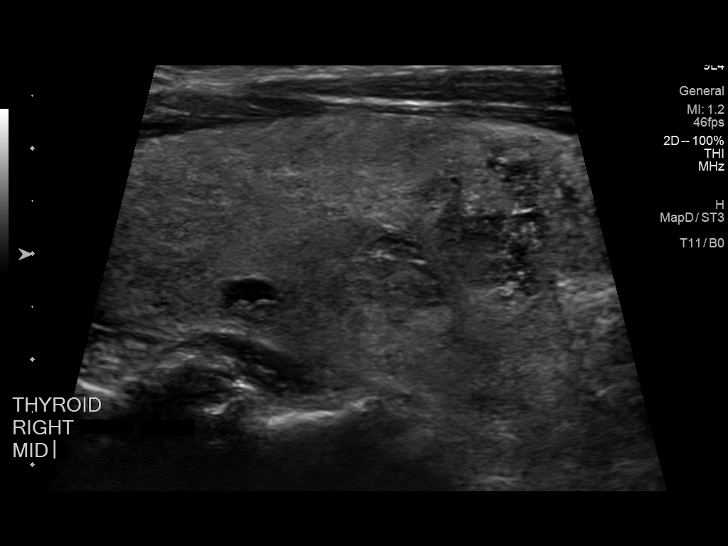
[im 6/20]
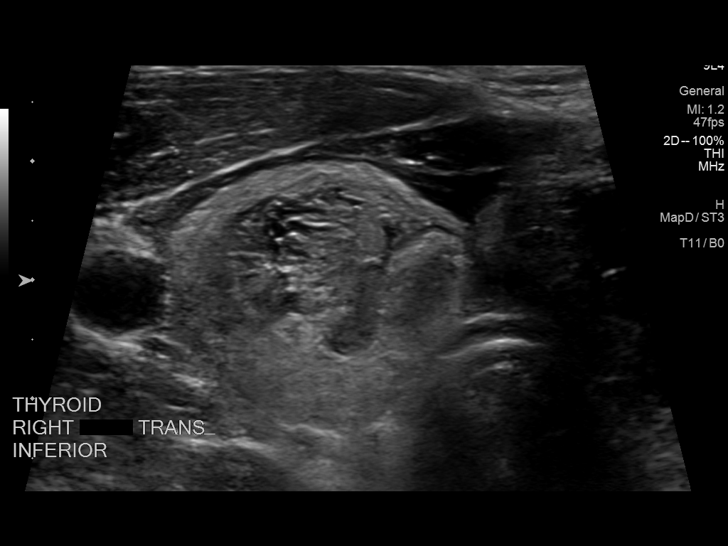
[im 9/20]
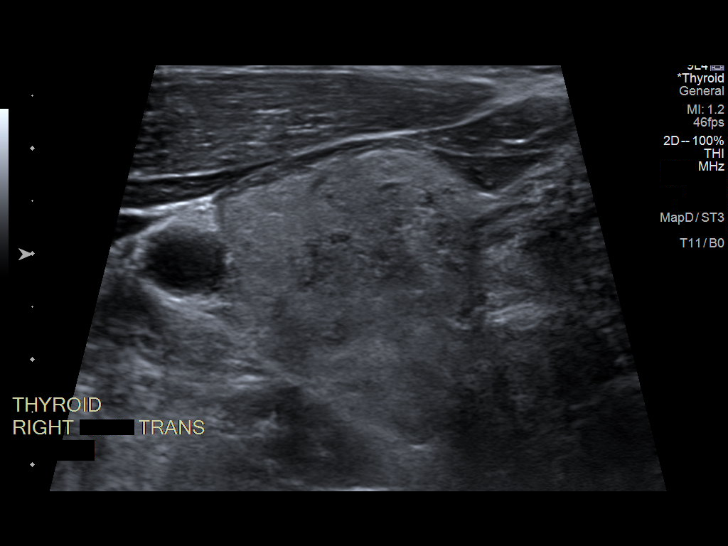
[im 13/20]
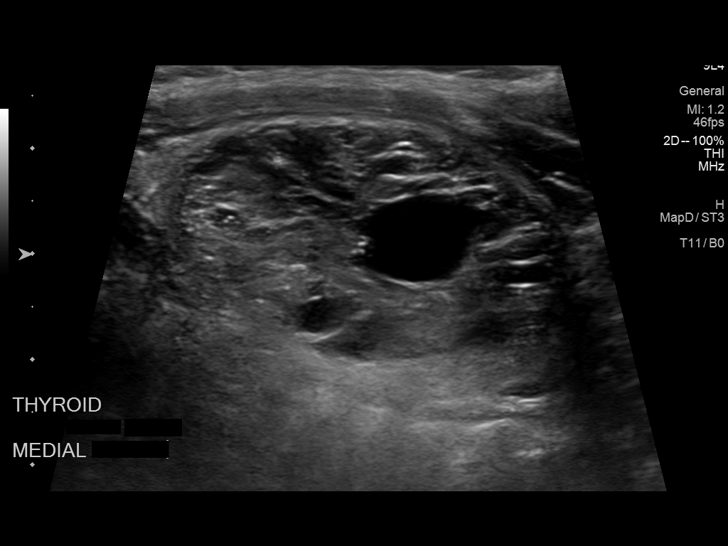
[im 16/20]
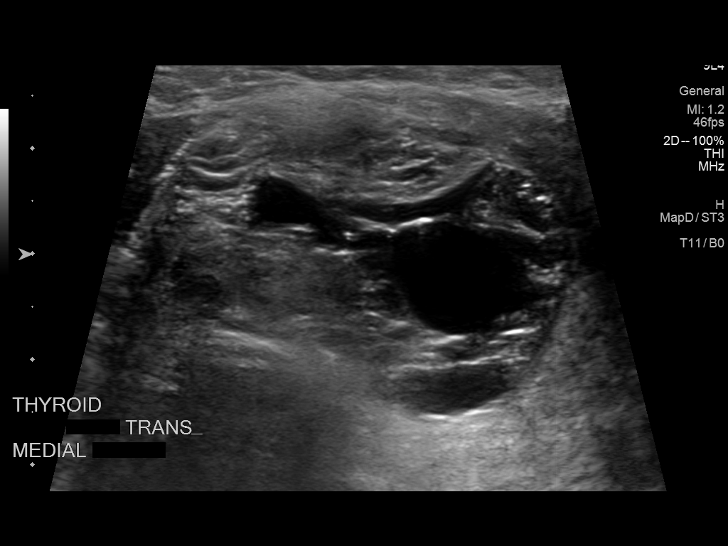
[im 20/20]
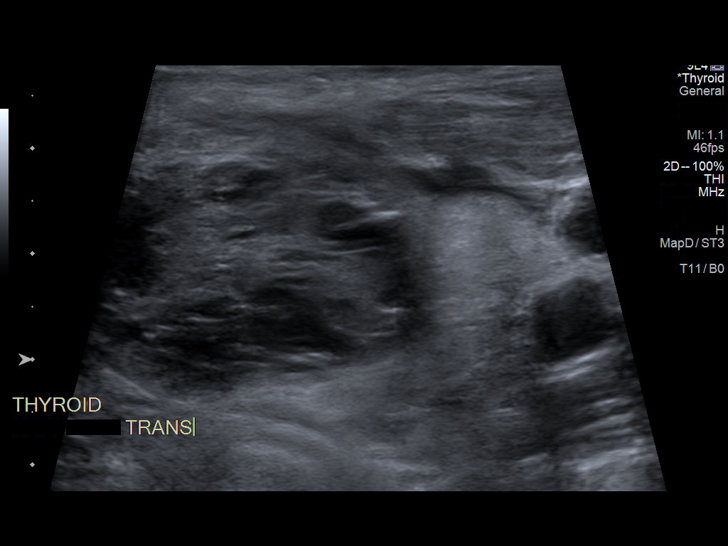

[Series 2: us fna biopsy thyroid 1st lesion · 23 acquisitions, 6 frames shown (2 of 2)]
[im 2/23]
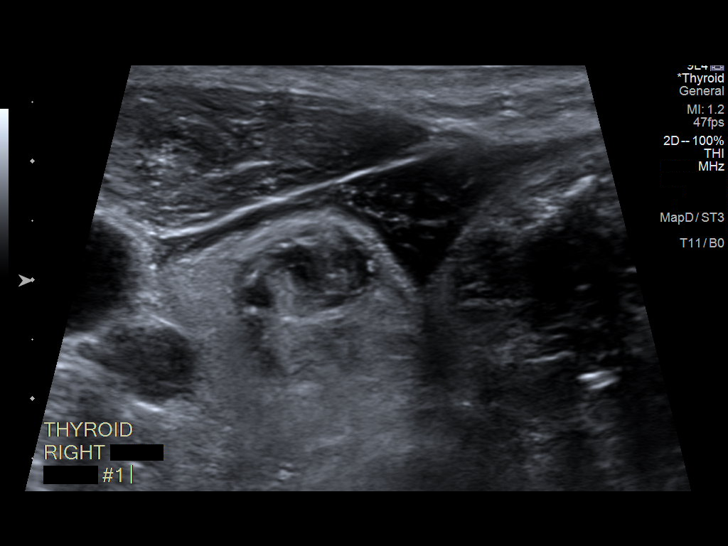
[im 6/23]
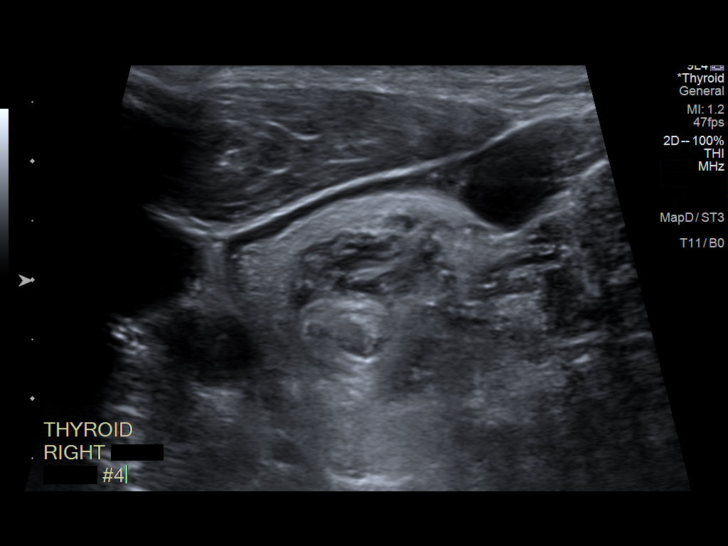
[im 10/23]
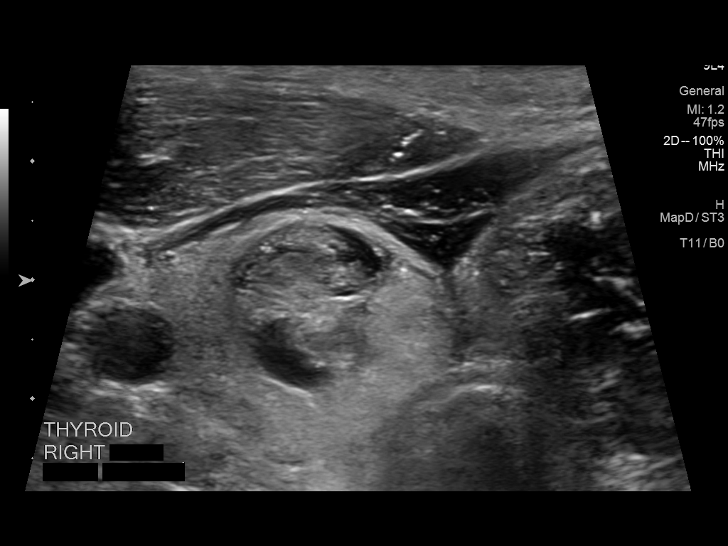
[im 13/23]
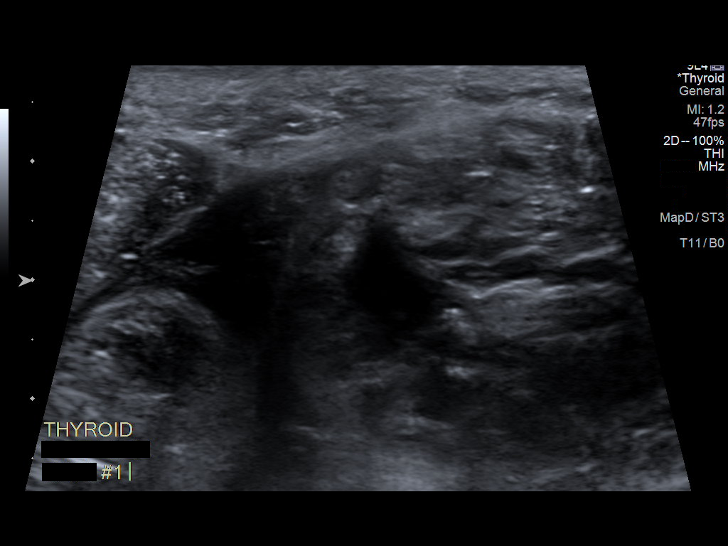
[im 17/23]
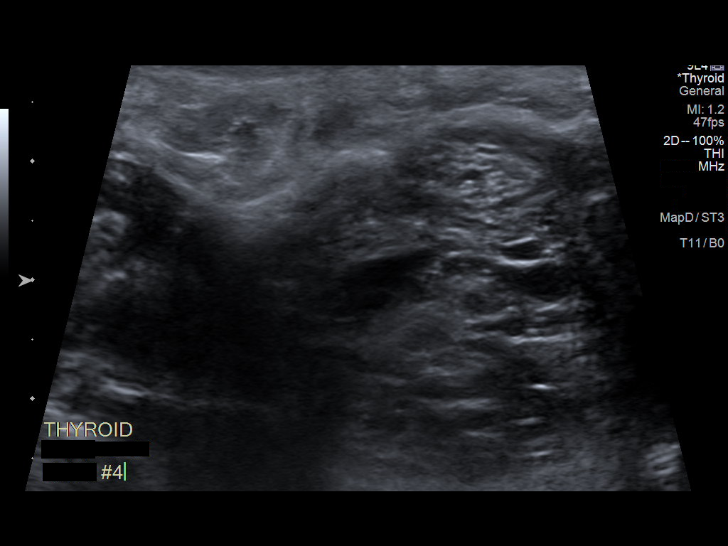
[im 21/23]
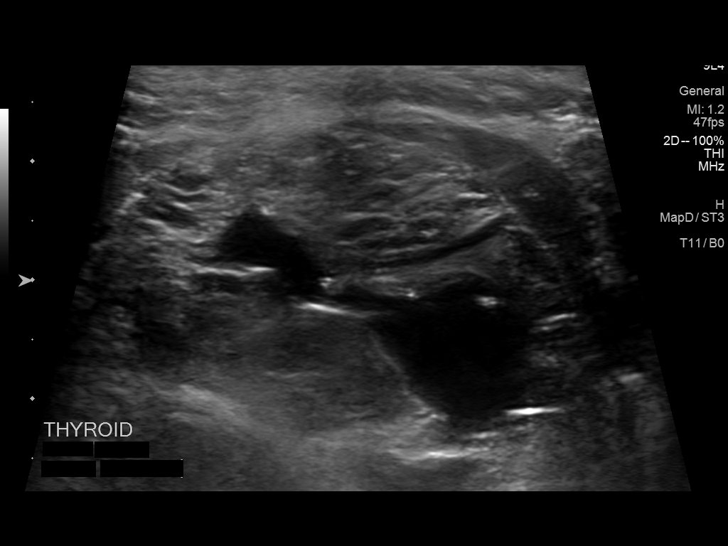

[12 of 25 positions shown; findings below may reference images not displayed]

Note, the preceding diagnostic thyroid ultrasound was reviewed prior
to the biopsy by supervising interventional radiologist, Dr. Don Lolito
and the right-sided thyroid nodule described as "superior" on
diagnostic ultrasound performed 10/03/2019 is "inferior" on today's
examination and the left-sided thyroid nodule described as
"superior" appears as a cluster of heterogeneous thyroid parenchyma
rather than a discrete nodule. Additionally, there is an
approximately 4.1 cm partially cystic though predominantly solid
nodule involving the majority of the left lobe of the thyroid which
was not imaged on the 10/03/2019 examination however meets
ultrasound criteria to undergo biopsy.

EXAM:
ULTRASOUND GUIDED FINE NEEDLE ASPIRATION OF INDETERMINATE THYROID
NODULES
On pre-procedural ultrasound scanning the previously described right
superior thyroid nodule was found to be in the inferior right
thyroid. The left nodule described as measuring 3.8 cm was actually
a conglomeration of smaller nodules. A left medial-inferior nodule
was identified and after discussion with Dr. Don Lolito was found to meet
criteria for biopsy today.

The procedure was planned. The neck was prepped in the usual sterile
fashion, and a sterile drape was applied covering the operative
field. A timeout was performed prior to the initiation of the
procedure. Local anesthesia was provided with 1% lidocaine.

Under direct ultrasound guidance, 5 FNA biopsies were performed of
the right inferior nodule with a 25 gauge needle. Multiple
ultrasound images were saved for procedural documentation purposes.
The samples were prepared and submitted to pathology. Sample was
also prepared for Afirma testing.

Under direct ultrasound guidance, 5 FNA biopsies were performed of
the left medial-inferior nodule with a 25 gauge needle. Multiple
ultrasound images were saved for procedural documentation purposes.
The samples were prepared and submitted to pathology. Sample was
also prepared for Afirma testing.

Limited post procedural scanning was negative for hematoma or
additional complication. Dressings were placed. The patient
tolerated the above procedures procedure well without immediate
postprocedural complication.
FINDINGS: Nodule reference number based on prior diagnostic ultrasound: 1

Maximum size: 3.4 cm

Location: Right; Inferior

ACR TI-RADS risk category: TR3 (3 points)

Reason for biopsy: meets ACR TI-RADS criteria

_________________________________________________________

Nodule reference number based on prior diagnostic ultrasound: Not
visualized on diagnostic thyroid ultrasound performed 10/03/2019

Maximum size:

Location: Left; Mid

ACR TI-RADS risk category: TR3 (3 points)

Reason for biopsy: meets ACR TI-RADS criteria

Ultrasound imaging confirms appropriate placement of the needles
within the thyroid nodule.
IMPRESSION: 1. Technically successful ultrasound guided fine needle aspiration
of indeterminate right inferior thyroid nodule.
2. Technically successful ultrasound guided fine needle aspiration
of indeterminate left medial-inferior thyroid nodule.

Note, the preceding diagnostic thyroid ultrasound was reviewed prior
to the biopsy by supervising interventional radiologist, Dr. Don Lolito,
and the right-sided thyroid nodule described as "superior" on
diagnostic ultrasound performed 10/03/2019 is "inferior" on today's
examination and the left-sided thyroid nodule described as
"superior" appears as a cluster of heterogeneous thyroid parenchyma
rather than a discrete nodule. Additionally, there is an
approximately 4.1 cm partially cystic though predominantly solid
nodule involving the majority of the left lobe of the thyroid which
was not imaged on the 10/03/2019 examination however meets
ultrasound criteria to undergo biopsy.

## 2021-10-13 ENCOUNTER — Telehealth: Payer: Self-pay | Admitting: Cardiology

## 2021-10-13 NOTE — Telephone Encounter (Signed)
Dr.Polite was transferred to Dr.Hochrein who spoke with him.   All questions answered.

## 2021-10-13 NOTE — Telephone Encounter (Signed)
Joni Reining with Dr. Idelle Crouch office states the doctor is requesting to speak with DOD. Phone: [phone]

## 2022-09-18 ENCOUNTER — Other Ambulatory Visit: Payer: Self-pay | Admitting: Cardiology

## 2022-09-18 ENCOUNTER — Other Ambulatory Visit: Payer: Self-pay

## 2022-09-18 MED ORDER — ROSUVASTATIN CALCIUM 10 MG PO TABS: 10.0000 mg | ORAL_TABLET | Freq: Every day | ORAL | 0 refills | Status: DC

## 2022-10-28 ENCOUNTER — Other Ambulatory Visit: Payer: Self-pay | Admitting: Cardiology

## 2022-11-03 NOTE — Progress Notes (Deleted)
Cardiology Office Note   Date:  11/03/2022   ID:  William Johnston, DOB 28-Jan-1971, MRN [id number]  PCP:  William Dills, MD  Cardiologist:   William Kuch [place], MD   No chief complaint on file.     History of Present Illness: William Johnston is a 52 y.o. male who is seen at the request of Dr William Johnston for evaluation of chest pain. He has a history of morbid obesity, HLD, and elevated BP that has not required medication. He reports intermittent chest pain. Over the weekend he noted pain in his central chest radiating to his left shoulder and some pain in his left clavicle. He went to the ED. Ecg showed no acute change. Troponins were 51 and 55. He is currently pain free. 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 To evaluate further he had a coronary CTA showing mild nonobstructive CAD.     Past Medical History:  Diagnosis Date   Allergy    Hyperlipidemia    Hypertension    Morbid obesity (HCC)    Thyroid nodule     Past Surgical History:  Procedure Laterality Date   HERNIA REPAIR       Current Outpatient Medications  Medication Sig Dispense Refill   metoprolol tartrate (LOPRESSOR) 100 MG tablet Take 100 mg  2 hours before Coronary CT 1 tablet 0   Multiple Vitamin (MULTIVITAMIN) tablet Take 1 tablet by mouth daily.     rosuvastatin (CRESTOR) 10 MG tablet TAKE 1 TABLET(10 MG) BY MOUTH DAILY 30 tablet 0   tadalafil (CIALIS) 20 MG tablet Take 20 mg by mouth daily as needed.     No current facility-administered medications for this visit.    Allergies:   Patient has no known allergies.    Social History:  The patient  reports that he has never smoked. He has never used smokeless tobacco. He reports that he does not drink alcohol and does not use drugs.   Family History:  The patient's family history includes Ovarian cancer in his mother.    ROS:  Please see the history of present illness.   Otherwise, review of systems are positive for none.   All other systems  are reviewed and negative.    PHYSICAL EXAM: VS:  There were no vitals taken for this visit. , BMI There is no height or weight on file to calculate BMI. GEN: Well nourished, obese, in no acute distress HEENT: normal Neck: no JVD, carotid bruits, or masses Cardiac: RRR; no murmurs, rubs, or gallops,no edema  Respiratory:  clear to auscultation bilaterally, normal work of breathing GI: soft, nontender, nondistended, + BS MS: no deformity or atrophy Skin: warm and dry, no rash Neuro:  Strength and sensation are intact Psych: euthymic mood, full affect   EKG:  EKG is not ordered today. The ekg ordered 08/27/21 demonstrates NSR with nonspecific T wave flattening inferiorly. I have personally reviewed and interpreted this study.    Recent Labs: No results found for requested labs within last 365 days.   Labs dated 05/30/21: cholesterol 193, triglycerides 269, LDL 111, HDL 35. CMET and TSH normal. Dated 10/13/21: normal CMET and TSH  Lipid Panel No results found for: "CHOL", "TRIG", "HDL", "CHOLHDL", "VLDL", "LDLCALC", "LDLDIRECT"    Wt Readings from Last 3 Encounters:  09/08/21 270 lb (122.5 kg)  08/29/21 281 lb (127.5 kg)  08/27/21 240 lb 1.3 oz (108.9 kg)      Other studies Reviewed: Additional studies/ records  that were reviewed today include:   Coronary CTA 09/15/21:  OVER-READ INTERPRETATION CARDIAC CT CHEST   The following report is an over-read performed by radiologist Dr. Gaylyn Johnston of [facility] on 09/15/2021. This over-read does not include interpretation of cardiac or coronary anatomy or pathology. The coronary CTA interpretation by the cardiologist is attached.   COMPARISON:  None.   FINDINGS: Extracardiac Vascular: Unremarkable   Mediastinum: Unremarkable   Lung: 3 mm calcified granuloma in the right lower lobe on image 18 series 10, benign. 2 mm subpleural calcified granuloma posteriorly in the left lower lobe, benign.   Included  Upper Abdomen: Unremarkable   Musculoskeletal: Unremarkable   IMPRESSION: 1. No significant extracardiac findings.     Electronically Signed   By: William Johnston M.D.   On: 09/15/2021 13:54    Addended by Herbie Baltimore, MD on 09/15/2021  1:57 PM    Study Result  Narrative & Impression  CLINICAL DATA:  Chest pain   EXAM: Cardiac/Coronary CTA   TECHNIQUE: A non-contrast, gated CT scan was obtained with axial slices of 3 mm through the heart for calcium scoring. Calcium scoring was performed using the Agatston method. A 120 kV prospective, gated, contrast cardiac scan was obtained. Gantry rotation speed was 250 msecs and collimation was 0.6 mm. Two sublingual nitroglycerin tablets (0.8 mg) were given. The 3D data set was reconstructed in 5% intervals of the 35-75% of the R-R cycle. Diastolic phases were analyzed on a dedicated workstation using MPR, MIP, and VRT modes. The patient received 95 cc of contrast.   FINDINGS: Image quality: Good.   Noise artifact is: Significant.   Coronary Arteries:  Normal coronary origin.  Right dominance.   Left main: The left main is a large caliber vessel with a normal take off from the left coronary cusp that bifurcates to form a left anterior descending artery and a left circumflex artery. There is no plaque or stenosis.   Left anterior descending artery: The LAD gives off 2 patent diagonal branches. There is minimal calcified plaque in the proximal LAD with associated stenosis of < 25%.   Left circumflex artery: The LCX is non-dominant and patent with no evidence of plaque or stenosis. The LCX gives off 2 patent obtuse marginal branches.   Right coronary artery: The RCA is dominant with normal take off from the right coronary cusp. There is no evidence of plaque or stenosis. The RCA terminates as a PDA and right posterolateral branch without evidence of plaque or stenosis.   Right Atrium: Right atrial size is within  normal limits.   Right Ventricle: The right ventricular cavity is within normal limits.   Left Atrium: Left atrial size is normal in size with no left atrial appendage filling defect.   Left Ventricle: The ventricular cavity size is within normal limits. There are no stigmata of prior infarction. There is no abnormal filling defect.   Pulmonary arteries: Normal in size without proximal filling defect.   Pulmonary veins: Normal pulmonary venous drainage.   Pericardium: Normal thickness with no significant effusion or calcium present.   Cardiac valves: The aortic valve is trileaflet without significant calcification. The mitral valve is normal structure without significant calcification.   Aorta: Normal caliber with no significant disease.   Extra-cardiac findings: See attached radiology report for non-cardiac structures.   IMPRESSION: 1. Coronary calcium score of 34.6. This was 88th percentile for age-, sex, and race-matched controls.   2.  Normal coronary origin with right dominance.  3.  Minimal atherosclerosis (<25% proximal LAD).  CAD RADS 1.   4.  Recommend preventive therapy and risk factor modification.        ASSESSMENT AND PLAN:  1.  Precordial chest pain with intermediate risk for CAD. Coronary CTA showed mild nonobstructive CAD. 2. HTN managed medically for now. May need to consider antihypertensive therapy 3. HLD mixed. Encourage weight loss and healthy diet. If he has CAD on CT will need statin therapy 4. Morbid obesity.    Current medicines are reviewed at length with the patient today.  The patient does not have concerns regarding medicines.  The following changes have been made:  no change  Labs/ tests ordered today include:   No orders of the defined types were placed in this encounter.        Disposition:   FU with me post CT  Signed, Zurri Rudden [place], MD  11/03/2022 7:27 AM    [facility] [address]  [place], [place], [address] Phone [phone], Fax [phone]

## 2022-11-09 ENCOUNTER — Ambulatory Visit: Payer: Self-pay | Admitting: Cardiology

## 2022-11-27 ENCOUNTER — Other Ambulatory Visit: Payer: Self-pay | Admitting: Cardiology

## 2022-12-31 ENCOUNTER — Other Ambulatory Visit: Payer: Self-pay | Admitting: Cardiology

## 2023-03-13 ENCOUNTER — Other Ambulatory Visit (HOSPITAL_COMMUNITY): Payer: Self-pay | Admitting: Internal Medicine

## 2023-03-13 DIAGNOSIS — R109 Unspecified abdominal pain: Secondary | ICD-10-CM

## 2023-03-14 ENCOUNTER — Ambulatory Visit (HOSPITAL_COMMUNITY)
Admission: RE | Admit: 2023-03-14 | Discharge: 2023-03-14 | Disposition: A | Discharge status: Home / Self Care | Payer: 59 | Source: Ambulatory Visit | Attending: Internal Medicine | Admitting: Internal Medicine

## 2023-03-14 DIAGNOSIS — R109 Unspecified abdominal pain: Secondary | ICD-10-CM | POA: Diagnosis present

## 2023-03-31 ENCOUNTER — Other Ambulatory Visit: Payer: Self-pay | Admitting: Cardiology

## 2023-06-21 ENCOUNTER — Ambulatory Visit: Admitting: Cardiology

## 2023-07-02 NOTE — Progress Notes (Signed)
 Cardiology Office Note    Patient Name: William Johnston Date of Encounter: 07/02/2023  Primary Care Provider:  Merl Star, MD Primary Cardiologist:  None Primary Electrophysiologist: None   Past Medical History    Past Medical History:  Diagnosis Date   Allergy    Hyperlipidemia    Hypertension    Morbid obesity (HCC)    Thyroid  nodule     History of Present Illness  William Johnston is a 53 y.o. male with a PMH of nonobstructive CAD, HLD, HTN, morbid obesity, thyroid  nodule who presents today for annual follow-up.  William Johnston was seen initially in 2023 by Dr. Swaziland by referral of Dr. Monnie Anthony for complaint of chest pain.  Ported central chest pain that radiated to his left shoulder and clavicle.  He was evaluated in the ED with normal EKG and mildly elevated troponins.  He was provided the option of stress testing versus coronary CTA.  He completed coronary CTA on 09/15/2021 that showed calcium  score of 34.6 with minimal atherosclerosis.  He was started on Crestor  10 mg and advised to follow-up with PCP.  William Johnston presents today for 2-year follow-up. He reports doing well from a cardiac perspective.  He has experienced a burning sensation in his arm, which he attributes to resting his arms on the desk. He describes it as 'not a pain, but a burning' and considers it might be similar to 'tennis elbow'. There is no radiation to the neck or shoulder, and no associated shortness of breath, dizziness, palpitations, or skipped heartbeats. He is currently on Ozempic and reports stable kidney function. He experiences occasional cramps, for which he was given Pepsin, but denies significant nausea or constipation. He acknowledges the need to increase physical activity, as he currently does not exercise as much as he would like due to work commitments. He engages in some physical activity through manual labor in his backyard. He has been working from home for about eight and a half years,  which may contribute to his arm discomfort. He monitors his blood pressure daily. No chest pain, shortness of breath, dizziness, palpitations, skipped heartbeats, or swelling. He reports a burning sensation in the arm but no numbness or tingling in the fingers. Patient denies palpitations, dyspnea, PND, orthopnea, nausea, vomiting, dizziness, syncope, edema, weight gain, or early satiety.  Discussed the use of AI scribe software for clinical note transcription with the patient, who gave verbal consent to proceed.  History of Present Illness   Review of Systems  Please see the history of present illness.    All other systems reviewed and are otherwise negative except as noted above.  Physical Exam     Wt Readings from Last 3 Encounters:  09/08/21 270 lb (122.5 kg)  08/29/21 281 lb (127.5 kg)  08/27/21 240 lb 1.3 oz (108.9 kg)   MV:HQION were no vitals filed for this visit.,There is no height or weight on file to calculate BMI. GEN: Well nourished, well developed in no acute distress Neck: No JVD; No carotid bruits Pulmonary: Clear to auscultation without rales, wheezing or rhonchi  Cardiovascular: Normal rate. Regular rhythm. Normal S1. Normal S2.   Murmurs: There is no murmur.  ABDOMEN: Soft, non-tender, non-distended EXTREMITIES:  No edema; No deformity   EKG/LABS/ Recent Cardiac Studies   ECG personally reviewed by me today -sinus rhythm with rate of 72 bpm and TWI inversions in lead III and aVF consistent with previous EKG  Risk Assessment/Calculations:  Lab Results  Component Value Date   WBC 10.0 09/08/2021   HGB 15.5 09/08/2021   HCT 48.8 09/08/2021   MCV 80.1 09/08/2021   PLT 335 09/08/2021   Lab Results  Component Value Date   CREATININE 0.95 09/08/2021   BUN 10 09/08/2021   NA 138 09/08/2021   K 5.0 09/08/2021   CL 102 09/08/2021   CO2 28 09/08/2021   No results found for: "CHOL", "HDL", "LDLCALC", "LDLDIRECT", "TRIG", "CHOLHDL"  No results found  for: "HGBA1C" Assessment & Plan   Assessment & Plan   1.  Nonobstructive CAD: - Patient reports doing well since follow-up with no cardiac chest pain. - EKG shows slight abnormality consistent with possible scarring from previous ischemia. LDL cholesterol is 54 mg/dL. - Continue Crestor  for cholesterol management. - Encourage increased physical activity to lower cardiovascular risk. - Monitor blood pressure regularly. - Follow up if symptoms recur.  2.  Essential hypertension: - Patient's blood pressure today is stable at 130/88 - Continue low-sodium heart healthy diet increase physical activity as tolerated  3.  Hyperlipidemia: - Patient's last LDL cholesterol was 54 at goal - Continue Crestor  10 mg as prescribed for cholesterol management. - Encourage increased physical activity to further reduce cardiovascular risk.  4.  Obesity: - Patient's BMI is 35.73 kg/m - He is currently on Ozempic and tolerating well - Patient advised and encouraged to increase physical activity to at least 150 minutes/week.  Disposition: Follow-up with None or APP in 24 months   Signed, Francene Ing, Retha Cast, NP 07/02/2023, 8:27 AM [facility]

## 2023-07-03 ENCOUNTER — Ambulatory Visit: Attending: Nurse Practitioner | Admitting: Nurse Practitioner

## 2023-07-03 ENCOUNTER — Encounter: Payer: Self-pay | Admitting: Nurse Practitioner

## 2023-07-03 VITALS — BP 130/88 | HR 72 | Ht 70.0 in | Wt 249.0 lb

## 2023-07-03 DIAGNOSIS — I2510 Atherosclerotic heart disease of native coronary artery without angina pectoris: Secondary | ICD-10-CM | POA: Diagnosis not present

## 2023-07-03 DIAGNOSIS — I10 Essential (primary) hypertension: Secondary | ICD-10-CM

## 2023-07-03 DIAGNOSIS — E669 Obesity, unspecified: Secondary | ICD-10-CM

## 2023-07-03 DIAGNOSIS — E782 Mixed hyperlipidemia: Secondary | ICD-10-CM

## 2023-07-03 MED ORDER — ROSUVASTATIN CALCIUM 10 MG PO TABS: 10.0000 mg | ORAL_TABLET | Freq: Every day | ORAL | 3 refills | Status: AC

## 2023-07-03 NOTE — Patient Instructions (Signed)
 Medication Instructions:  Your physician recommends that you continue on your current medications as directed. Please refer to the Current Medication list given to you today. *If you need a refill on your cardiac medications before your next appointment, please call your pharmacy*  Lab Work: NONE ORDERED If you have labs (blood work) drawn today and your tests are completely normal, you will receive your results only by: MyChart Message (if you have MyChart) OR A paper copy in the mail If you have any lab test that is abnormal or we need to change your treatment, we will call you to review the results.  Testing/Procedures: NONE ORDERED  Follow-Up: At [facility], you and your health needs are our priority.  As part of our continuing mission to provide you with exceptional heart care, our providers are all part of one team.  This team includes your primary Cardiologist (physician) and Advanced Practice Providers or APPs (Physician Assistants and Nurse Practitioners) who all work together to provide you with the care you need, when you need it.  Your next appointment:   2 year(s)  Provider:   Peter Swaziland, MD or Charles Connor, NP  We recommend signing up for the patient portal called "MyChart".  Sign up information is provided on this After Visit Summary.  MyChart is used to connect with patients for Virtual Visits (Telemedicine).  Patients are able to view lab/test results, encounter notes, upcoming appointments, etc.  Non-urgent messages can be sent to your provider as well.   To learn more about what you can do with MyChart, go to [email or web address].   Other Instructions
